# Patient Record
Sex: Female | Born: 1980 | Race: White | Hispanic: No | Marital: Single | Smoking: Current every day smoker
Health system: Southern US, Community
[De-identification: ages and names within clinical notes are randomized; demographics above are authoritative.]

## PROBLEM LIST (undated history)

## (undated) DIAGNOSIS — F329 Major depressive disorder, single episode, unspecified: Secondary | ICD-10-CM

## (undated) DIAGNOSIS — R112 Nausea with vomiting, unspecified: Secondary | ICD-10-CM

## (undated) DIAGNOSIS — F32A Depression, unspecified: Secondary | ICD-10-CM

## (undated) DIAGNOSIS — F3181 Bipolar II disorder: Secondary | ICD-10-CM

## (undated) DIAGNOSIS — M549 Dorsalgia, unspecified: Secondary | ICD-10-CM

## (undated) DIAGNOSIS — N809 Endometriosis, unspecified: Secondary | ICD-10-CM

## (undated) DIAGNOSIS — K5900 Constipation, unspecified: Secondary | ICD-10-CM

## (undated) DIAGNOSIS — Z87898 Personal history of other specified conditions: Secondary | ICD-10-CM

## (undated) DIAGNOSIS — Z8659 Personal history of other mental and behavioral disorders: Secondary | ICD-10-CM

## (undated) DIAGNOSIS — G43909 Migraine, unspecified, not intractable, without status migrainosus: Secondary | ICD-10-CM

## (undated) DIAGNOSIS — Z8614 Personal history of Methicillin resistant Staphylococcus aureus infection: Secondary | ICD-10-CM

## (undated) DIAGNOSIS — J449 Chronic obstructive pulmonary disease, unspecified: Secondary | ICD-10-CM

## (undated) DIAGNOSIS — I8390 Asymptomatic varicose veins of unspecified lower extremity: Secondary | ICD-10-CM

## (undated) DIAGNOSIS — Z972 Presence of dental prosthetic device (complete) (partial): Secondary | ICD-10-CM

## (undated) DIAGNOSIS — Z8619 Personal history of other infectious and parasitic diseases: Secondary | ICD-10-CM

## (undated) DIAGNOSIS — I10 Essential (primary) hypertension: Secondary | ICD-10-CM

## (undated) DIAGNOSIS — K029 Dental caries, unspecified: Secondary | ICD-10-CM

## (undated) HISTORY — PX: ANKLE SURGERY: SHX546

## (undated) HISTORY — DX: Presence of dental prosthetic device (complete) (partial): Z97.2

## (undated) HISTORY — DX: Personal history of other specified conditions: Z87.898

## (undated) HISTORY — DX: Asymptomatic varicose veins of unspecified lower extremity: I83.90

## (undated) HISTORY — DX: Depression, unspecified: F32.A

## (undated) HISTORY — DX: Nausea with vomiting, unspecified: R11.2

## (undated) HISTORY — DX: Bipolar II disorder: F31.81

## (undated) HISTORY — DX: Dorsalgia, unspecified: M54.9

## (undated) HISTORY — DX: Dental caries, unspecified: K02.9

## (undated) HISTORY — DX: Migraine, unspecified, not intractable, without status migrainosus: G43.909

## (undated) HISTORY — DX: Constipation, unspecified: K59.00

## (undated) HISTORY — PX: OTHER SURGICAL HISTORY: SHX169

## (undated) HISTORY — DX: Personal history of Methicillin resistant Staphylococcus aureus infection: Z86.14

## (undated) HISTORY — DX: Personal history of other mental and behavioral disorders: Z86.59

---
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
## 2005-09-23 LAB — OB RESULTS CONSOLE RUBELLA ANTIBODY, IGM
Rubella: IMMUNE
Rubella: IMMUNE

## 2005-09-23 LAB — OB RESULTS CONSOLE GC/CHLAMYDIA
Chlamydia: NEGATIVE
Gonorrhea: NEGATIVE

## 2005-09-23 LAB — OB RESULTS CONSOLE HIV ANTIBODY (ROUTINE TESTING): HIV: NONREACTIVE

## 2005-09-23 LAB — OB RESULTS CONSOLE TSH: TSH: 3

## 2005-10-20 LAB — OB RESULTS CONSOLE RUBELLA ANTIBODY, IGM: Rubella: UNDETERMINED

## 2006-05-06 ENCOUNTER — Observation Stay: Payer: Self-pay

## 2006-05-10 ENCOUNTER — Inpatient Hospital Stay: Payer: Self-pay | Admitting: Unknown Physician Specialty

## 2006-05-11 DIAGNOSIS — O139 Gestational [pregnancy-induced] hypertension without significant proteinuria, unspecified trimester: Secondary | ICD-10-CM

## 2006-06-04 ENCOUNTER — Emergency Department: Payer: Self-pay | Admitting: Emergency Medicine

## 2006-12-01 ENCOUNTER — Emergency Department: Payer: Self-pay | Admitting: Emergency Medicine

## 2010-04-26 ENCOUNTER — Emergency Department (HOSPITAL_COMMUNITY): Admission: EM | Admit: 2010-04-26 | Discharge: 2010-04-27 | Payer: Self-pay | Admitting: Emergency Medicine

## 2010-04-28 ENCOUNTER — Emergency Department: Payer: Self-pay | Admitting: Unknown Physician Specialty

## 2010-06-02 ENCOUNTER — Emergency Department: Payer: Self-pay | Admitting: Internal Medicine

## 2010-08-09 ENCOUNTER — Emergency Department: Payer: Self-pay | Admitting: Emergency Medicine

## 2010-08-11 ENCOUNTER — Emergency Department: Payer: Self-pay | Admitting: Emergency Medicine

## 2010-08-15 ENCOUNTER — Emergency Department: Payer: Self-pay | Admitting: Emergency Medicine

## 2011-04-03 ENCOUNTER — Emergency Department: Payer: Self-pay | Admitting: Emergency Medicine

## 2013-01-01 ENCOUNTER — Emergency Department: Payer: Self-pay | Admitting: Internal Medicine

## 2013-10-14 ENCOUNTER — Emergency Department: Payer: Self-pay | Admitting: Emergency Medicine

## 2013-10-15 ENCOUNTER — Emergency Department (HOSPITAL_COMMUNITY)
Admission: EM | Admit: 2013-10-15 | Discharge: 2013-10-15 | Discharge status: Against Medical Advice | Payer: Medicaid Other | Attending: Emergency Medicine | Admitting: Emergency Medicine

## 2013-10-15 ENCOUNTER — Encounter (HOSPITAL_COMMUNITY): Payer: Self-pay | Admitting: Emergency Medicine

## 2013-10-15 DIAGNOSIS — F17200 Nicotine dependence, unspecified, uncomplicated: Secondary | ICD-10-CM | POA: Insufficient documentation

## 2013-10-15 DIAGNOSIS — I10 Essential (primary) hypertension: Secondary | ICD-10-CM | POA: Insufficient documentation

## 2013-10-15 DIAGNOSIS — R42 Dizziness and giddiness: Secondary | ICD-10-CM | POA: Insufficient documentation

## 2013-10-15 DIAGNOSIS — R079 Chest pain, unspecified: Secondary | ICD-10-CM | POA: Insufficient documentation

## 2013-10-15 DIAGNOSIS — R0602 Shortness of breath: Secondary | ICD-10-CM | POA: Insufficient documentation

## 2013-10-15 NOTE — ED Notes (Addendum)
Pt c/o mid center chest pain that radiates to back area with sob, dizziness that started today, pt reports that the pain started after she started taking two new medications, was seen at [facility] yesterday for "knots" to bilateral rib cage area, had chest xray performed, given etodolac and diazepam and a shot while in er but does not know what the shot was. Pt states that the pain is made worse when she breathes out,

## 2013-10-15 NOTE — ED Notes (Signed)
Called patient to take to room. No answer. Patient left without being treated.

## 2015-01-24 IMAGING — CT CT CHEST W/O CM
1 series · 16 of 33 positions shown, 20 images · non-contrast
Comparison: none

REASON FOR EXAM: questionable abnormality on plain film; pain and soft
tissue swelling over [place]
COMMENTS:

PROCEDURE:     CT  - CT CHEST WITHOUT CONTRAST  - January 01, 2013  [DATE]
RESULT:     Comparison: None
TECHNIQUE: Multiple axial images of the chest were obtained without
intravenous contrast.

[Series 2: soft tissue · axial · 0.75mm/px · z∈[-143,+187]mm · 16 of 120 slices shown, 20 images]
[im 5/120  mediastinal]
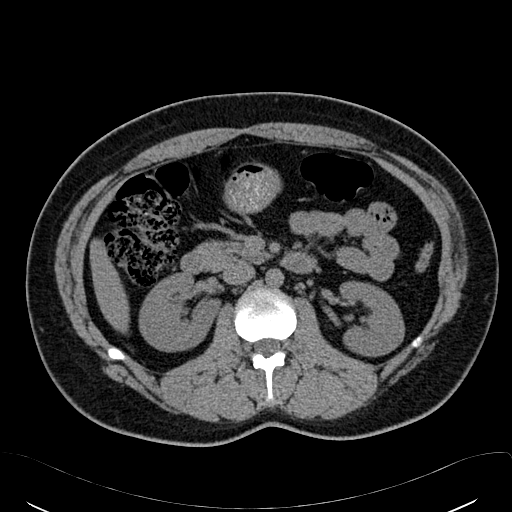
[im 5/120  lung]
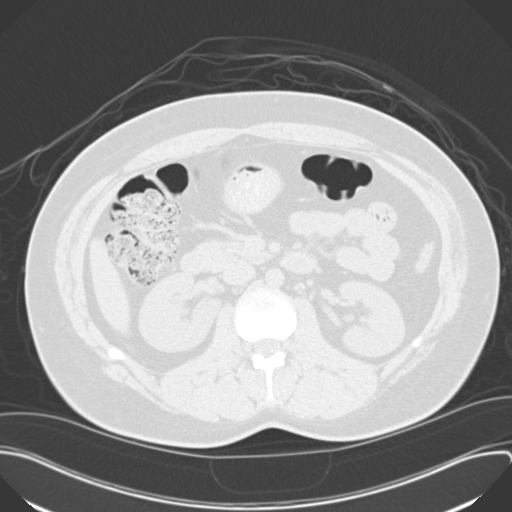
[im 14/120  lung]
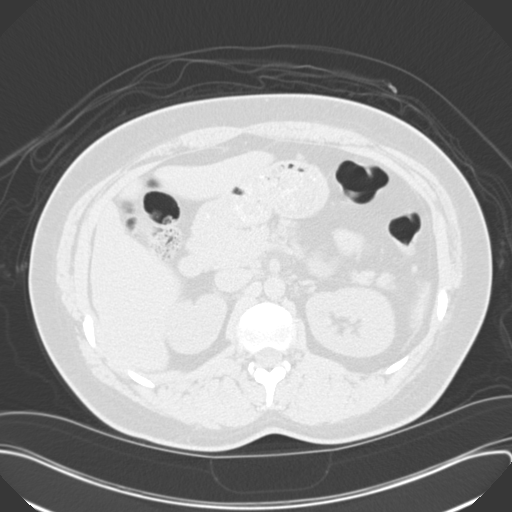
[im 23/120  lung]
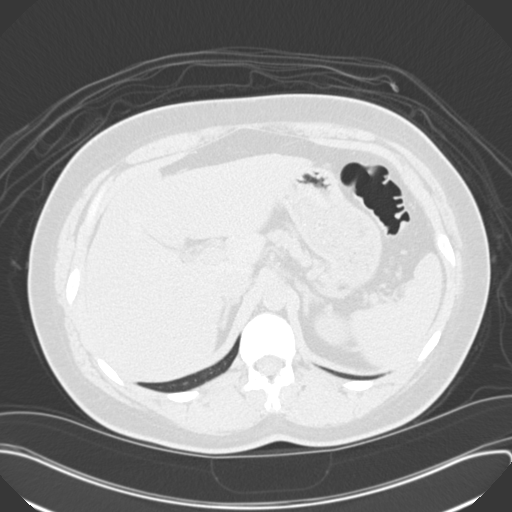
[im 27/120  lung]
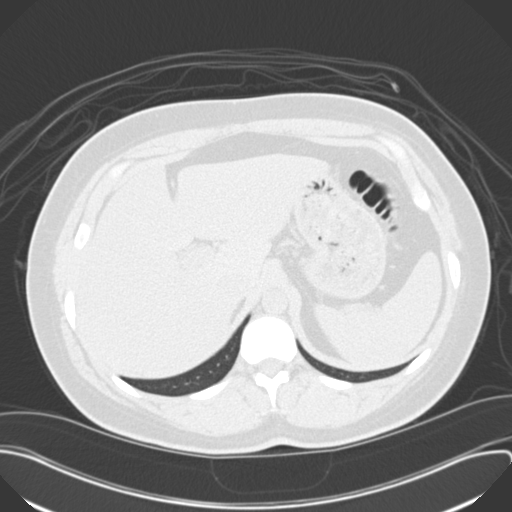
[im 36/120  mediastinal]
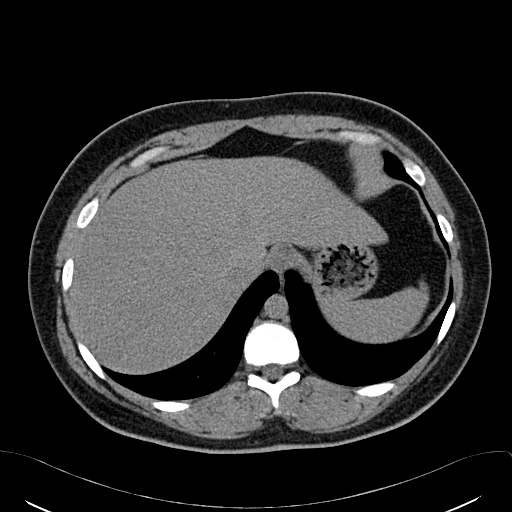
[im 36/120  lung]
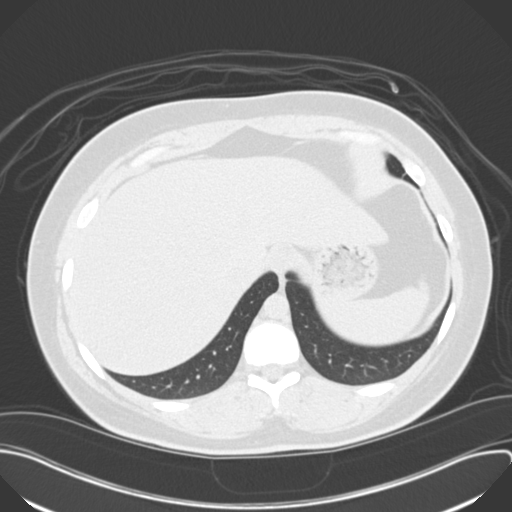
[im 45/120  lung]
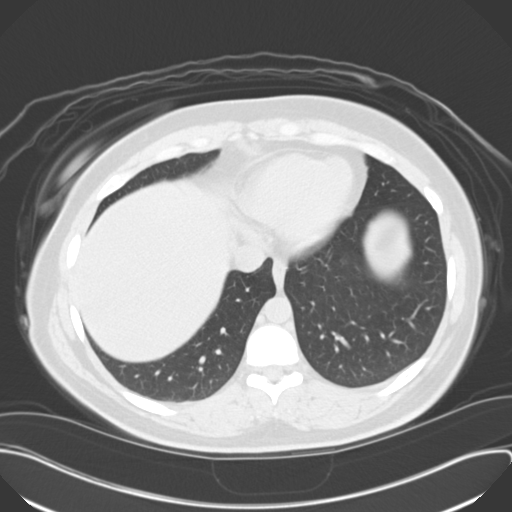
[im 49/120  lung]
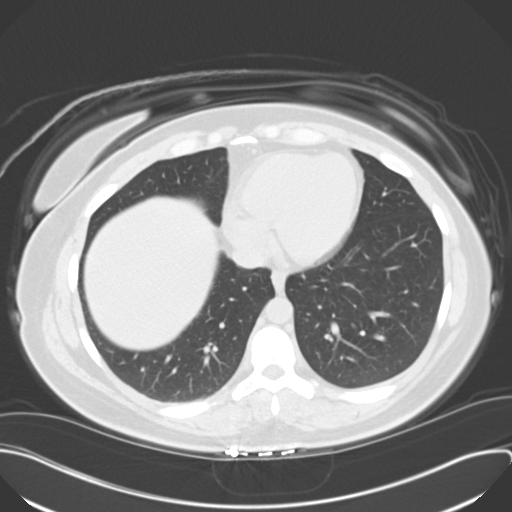
[im 58/120  lung]
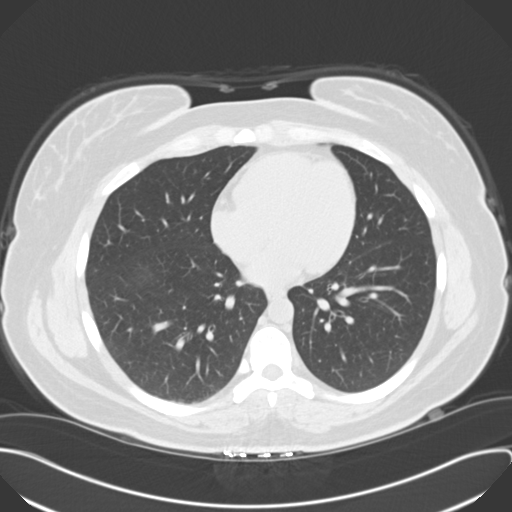
[im 63/120  mediastinal]
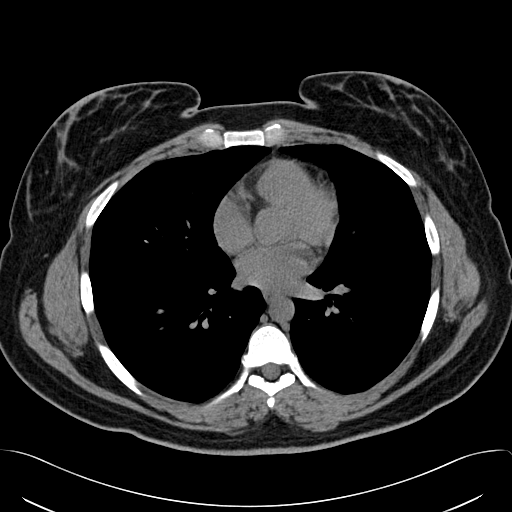
[im 63/120  lung]
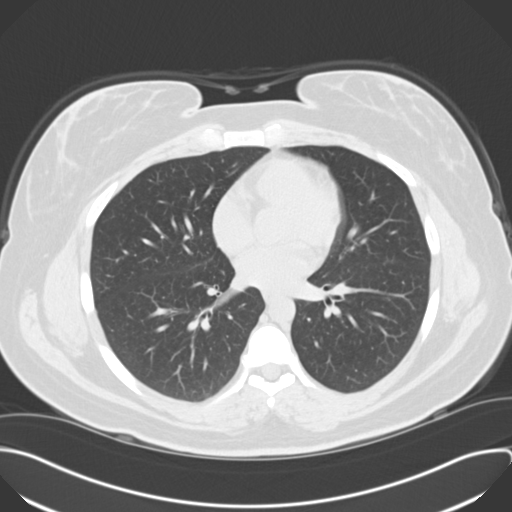
[im 71/120  lung]
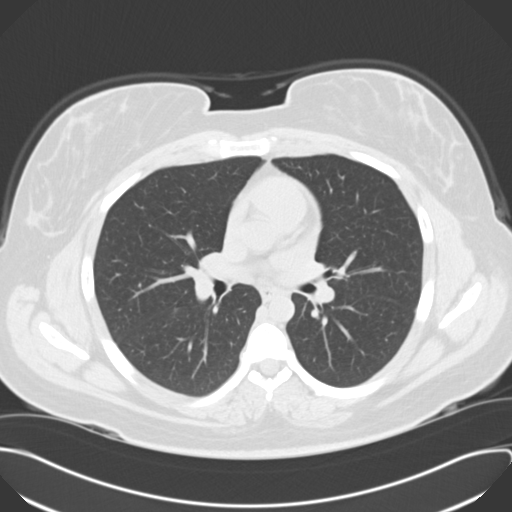
[im 75/120  lung]
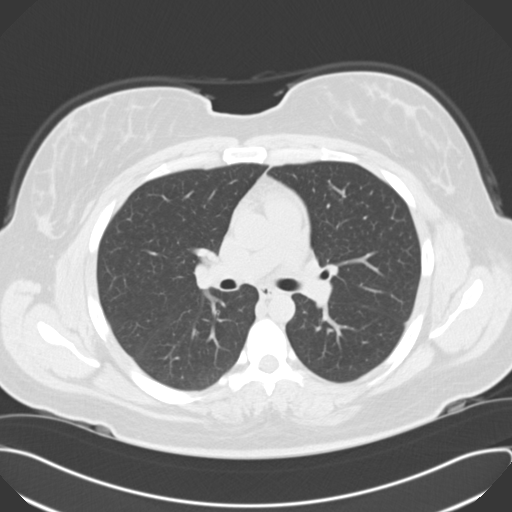
[im 84/120  lung]
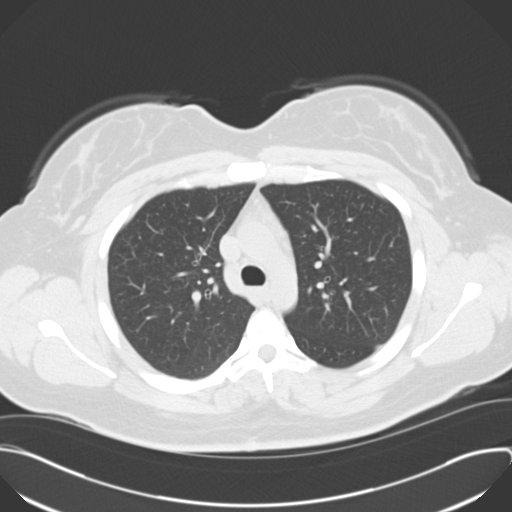
[im 93/120  mediastinal]
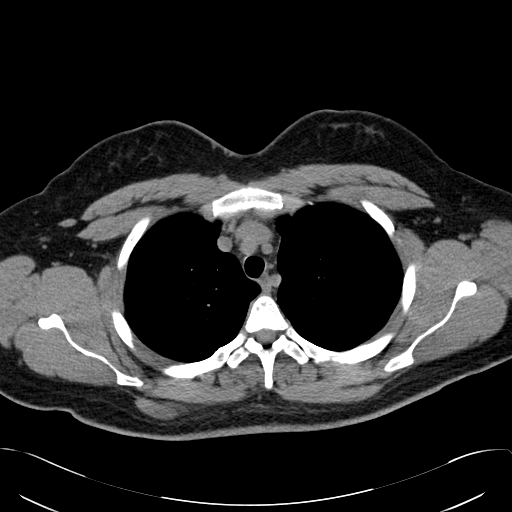
[im 93/120  lung]
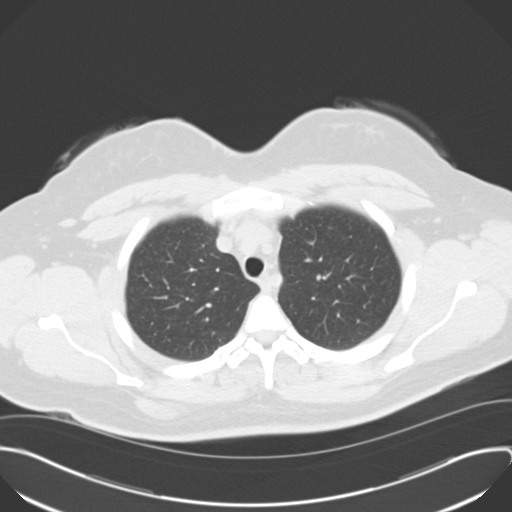
[im 97/120  lung]
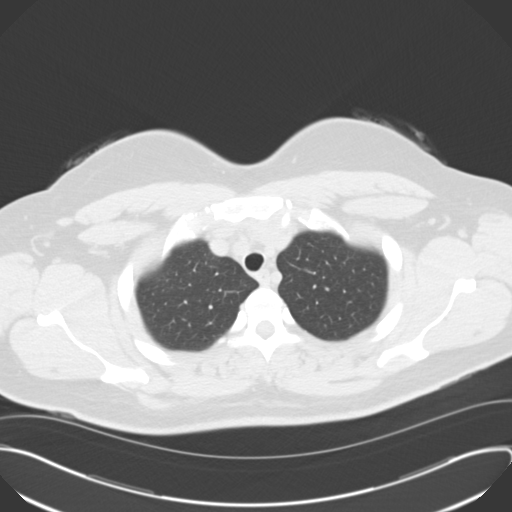
[im 106/120  lung]
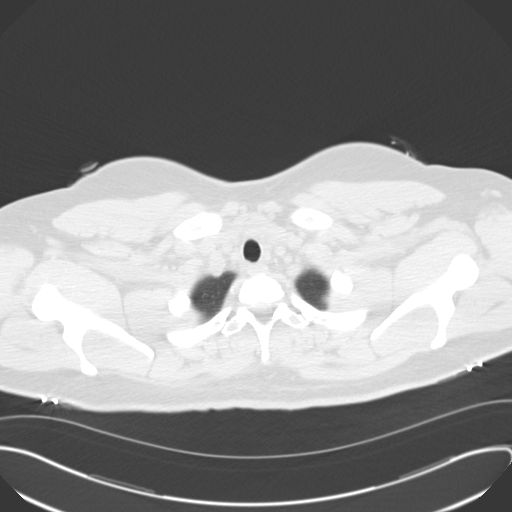
[im 115/120  lung]
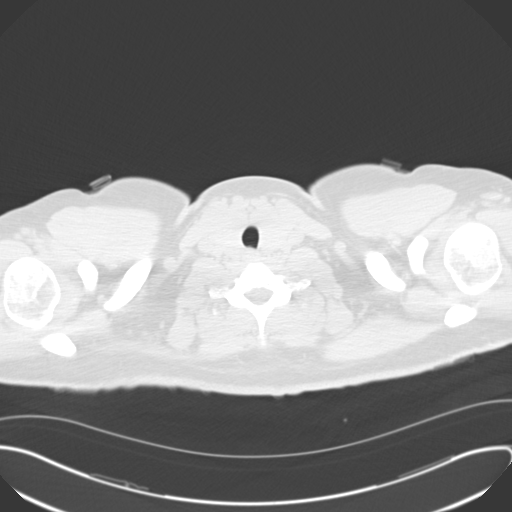

[16 of 33 positions shown; findings below may reference images not displayed]

FINDINGS: Minimal soft tissue density in the anterior mediastinum is likely to
residual thymic tissue. No mediastinal, hilar, or axillary lymphadenopathy.
A marker was placed along the inferior lateral aspect of the right
hemithorax at the site of the palpable abnormality. No mass is identified at
this site. This is at the level between the lateral right eleventh and
twelfth ribs.

No focal pulmonary opacities. The central airways are patent. There is a 3
mm nodule in the right lower lobe.

No aggressive lytic or sclerotic osseous lesions are identified.
IMPRESSION: No mass identified along the lateral right lower thorax at the site of
reported palpable abnormality.

[REDACTED]

## 2015-01-24 IMAGING — CR DG RIBS 2V*R*
1 series · 4 of 4 positions shown · non-contrast
Comparison: none

REASON FOR EXAM: pain
COMMENTS:

PROCEDURE:     DXR - DXR RIBS RIGHT UNILATERAL  - January 01, 2013  [DATE]
RESULT:     Comparison: None.

[Series 1: w ribs ap upper right · 0.14mm/px · 4 of 4 slices shown]
[im 1/4]
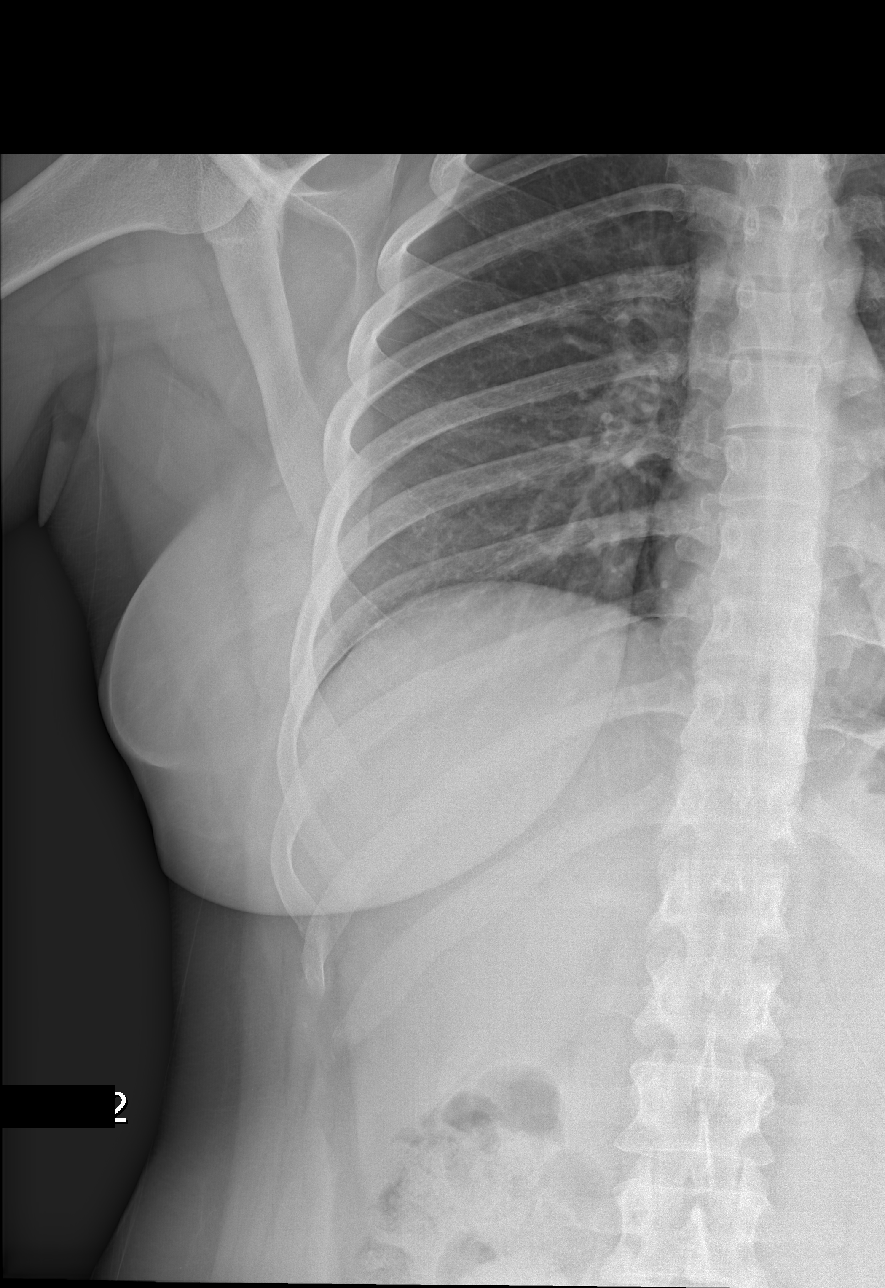
[im 2/4]
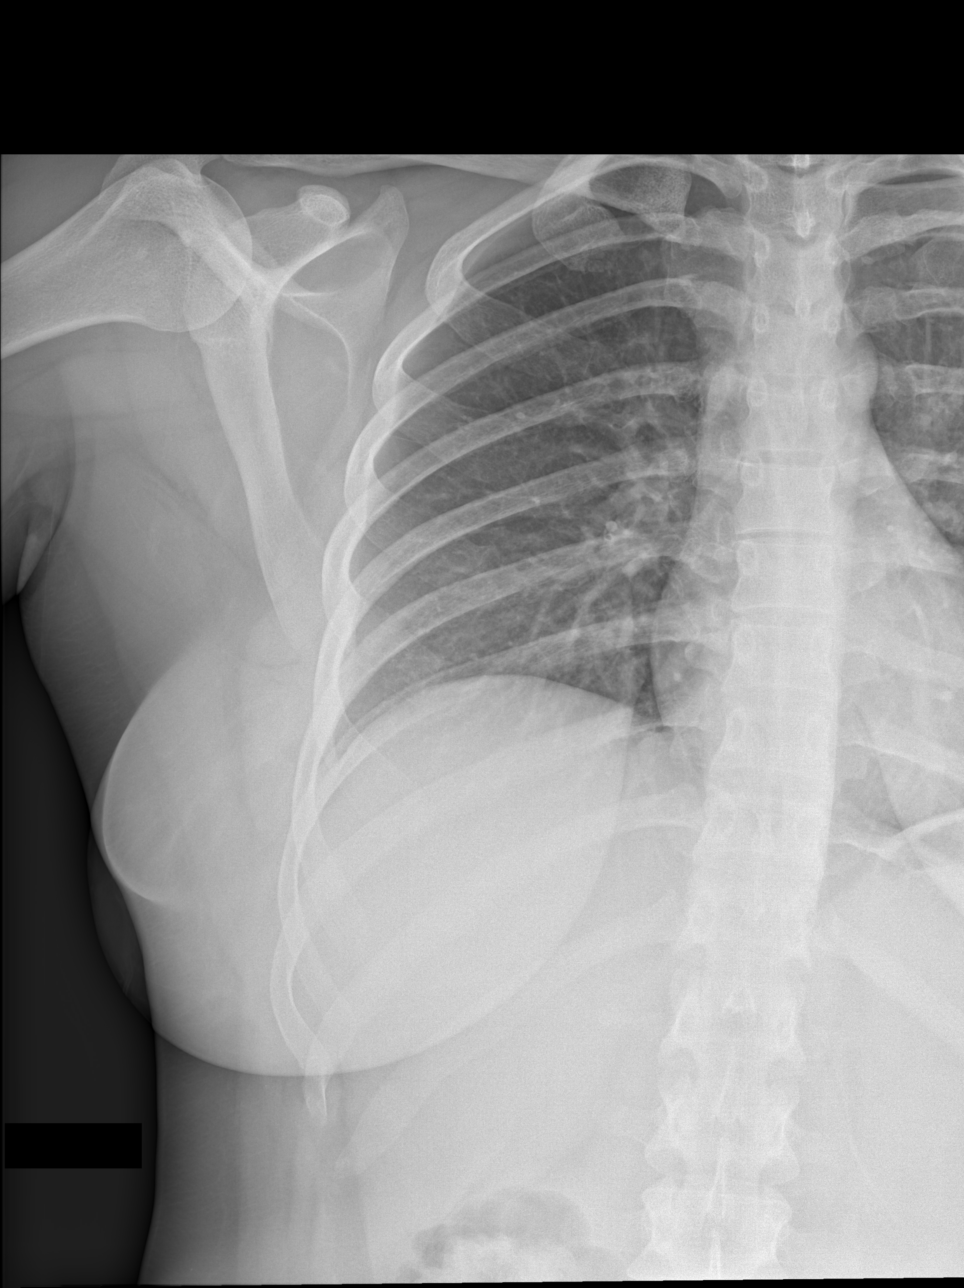
[im 3/4]
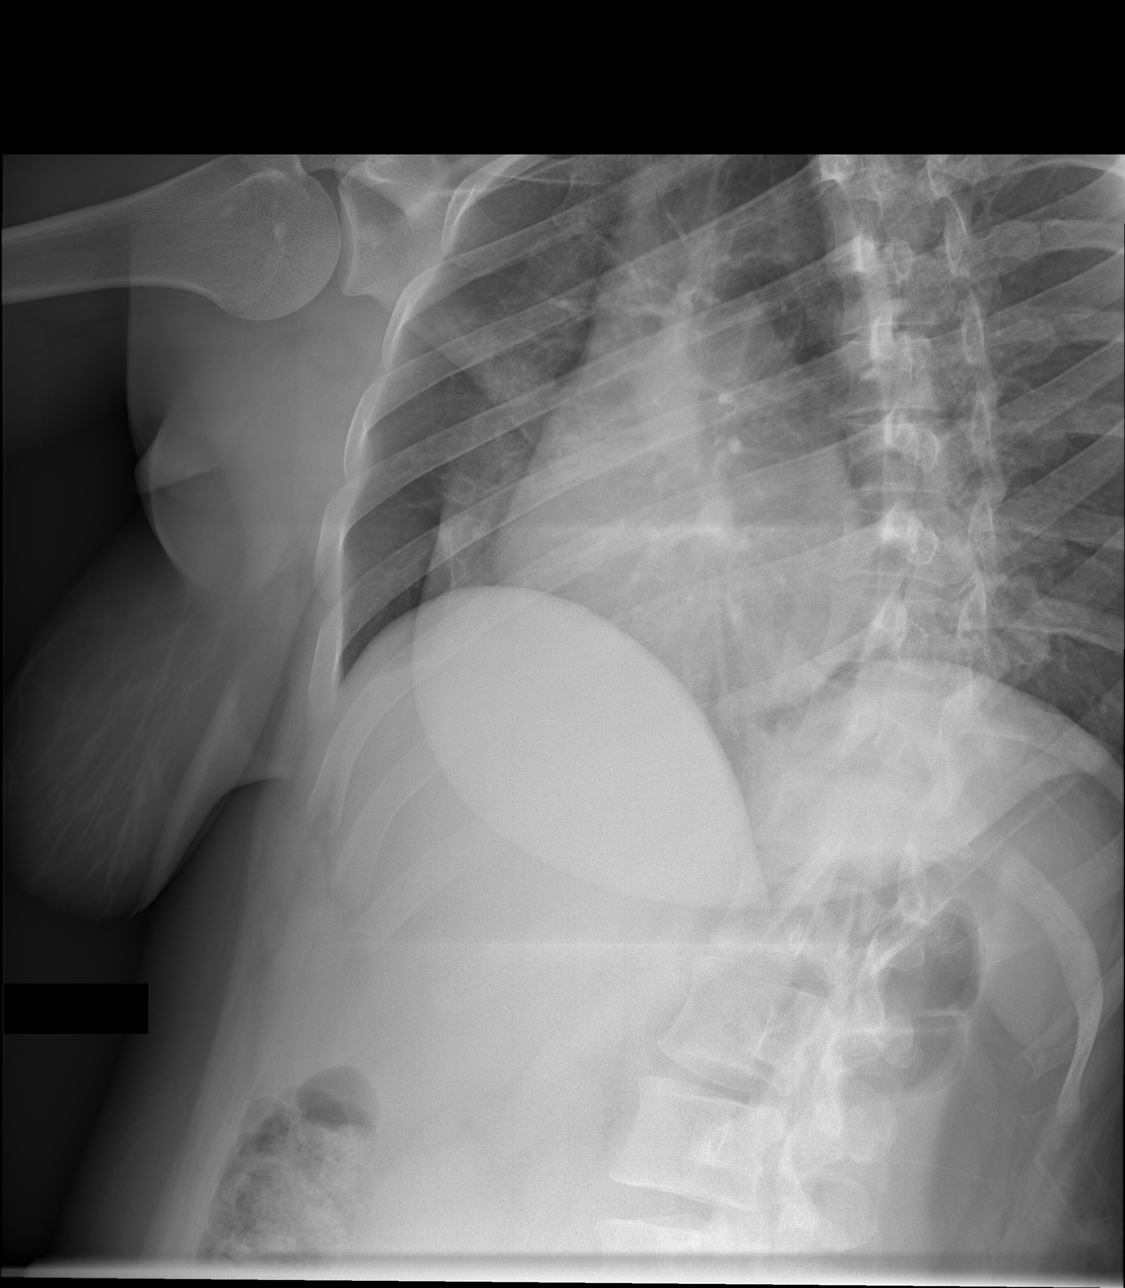
[im 4/4]
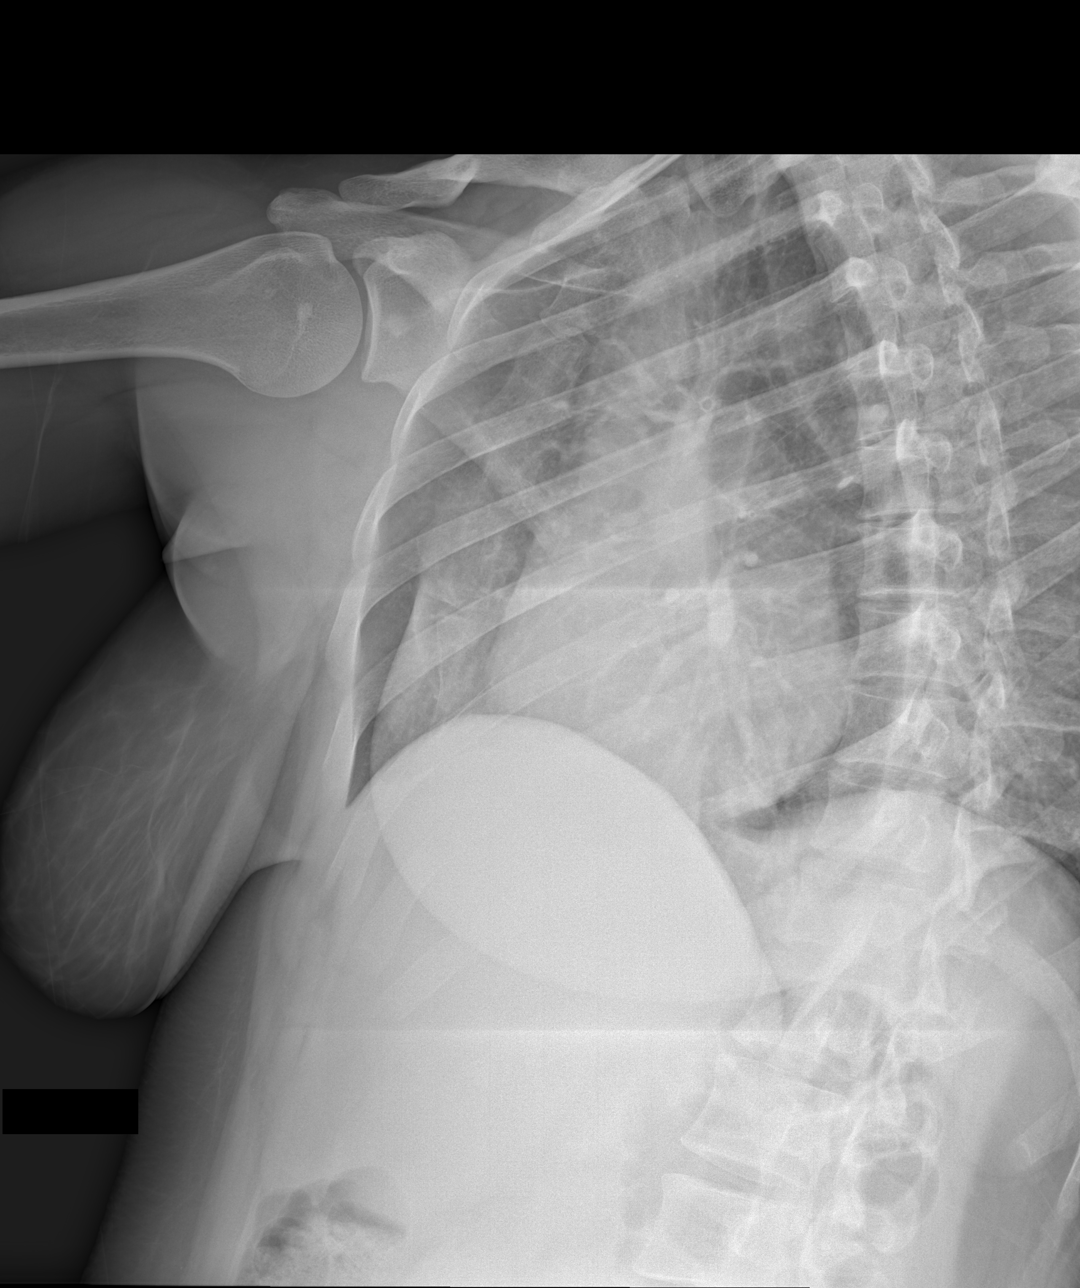

[4 of 4 positions shown; findings below may reference images not displayed]

FINDINGS: No displaced rib fracture seen.
IMPRESSION: No displaced rib fracture seen.

[REDACTED]

## 2015-05-28 ENCOUNTER — Emergency Department
Admission: EM | Admit: 2015-05-28 | Discharge: 2015-05-28 | Disposition: A | Discharge status: Home / Self Care | Payer: Medicaid Other | Attending: Emergency Medicine | Admitting: Emergency Medicine

## 2015-05-28 ENCOUNTER — Emergency Department: Payer: Medicaid Other

## 2015-05-28 ENCOUNTER — Encounter: Payer: Self-pay | Admitting: Emergency Medicine

## 2015-05-28 DIAGNOSIS — Z880 Allergy status to penicillin: Secondary | ICD-10-CM | POA: Insufficient documentation

## 2015-05-28 DIAGNOSIS — M7918 Myalgia, other site: Secondary | ICD-10-CM

## 2015-05-28 DIAGNOSIS — S6991XA Unspecified injury of right wrist, hand and finger(s), initial encounter: Secondary | ICD-10-CM | POA: Insufficient documentation

## 2015-05-28 DIAGNOSIS — S161XXA Strain of muscle, fascia and tendon at neck level, initial encounter: Secondary | ICD-10-CM | POA: Diagnosis not present

## 2015-05-28 DIAGNOSIS — Y998 Other external cause status: Secondary | ICD-10-CM | POA: Insufficient documentation

## 2015-05-28 DIAGNOSIS — Z720 Tobacco use: Secondary | ICD-10-CM | POA: Diagnosis not present

## 2015-05-28 DIAGNOSIS — Y9389 Activity, other specified: Secondary | ICD-10-CM | POA: Insufficient documentation

## 2015-05-28 DIAGNOSIS — Y92410 Unspecified street and highway as the place of occurrence of the external cause: Secondary | ICD-10-CM | POA: Insufficient documentation

## 2015-05-28 DIAGNOSIS — S199XXA Unspecified injury of neck, initial encounter: Secondary | ICD-10-CM | POA: Diagnosis present

## 2015-05-28 MED ORDER — DIAZEPAM 2 MG PO TABS: 2.0000 mg | ORAL_TABLET | Freq: Three times a day (TID) | ORAL | Status: AC | PRN

## 2015-05-28 MED ORDER — NAPROXEN 500 MG PO TABS
500.0000 mg | ORAL_TABLET | Freq: Once | ORAL | Status: AC
  Administered 2015-05-28: 500 mg via ORAL
  Filled 2015-05-28: qty 1

## 2015-05-28 MED ORDER — DIAZEPAM 2 MG PO TABS
2.0000 mg | ORAL_TABLET | Freq: Once | ORAL | Status: AC
  Administered 2015-05-28: 2 mg via ORAL
  Filled 2015-05-28: qty 1

## 2015-05-28 MED ORDER — NAPROXEN 500 MG PO TABS: 500.0000 mg | ORAL_TABLET | Freq: Two times a day (BID) | ORAL | Status: AC
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
## 2015-05-28 NOTE — Discharge Instructions (Signed)
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
## 2015-07-04 ENCOUNTER — Other Ambulatory Visit: Payer: Self-pay | Admitting: Nurse Practitioner

## 2015-07-04 DIAGNOSIS — M272 Inflammatory conditions of jaws: Secondary | ICD-10-CM

## 2015-07-09 ENCOUNTER — Ambulatory Visit
Admission: RE | Admit: 2015-07-09 | Discharge: 2015-07-09 | Disposition: A | Discharge status: Home / Self Care | Payer: Medicaid Other | Source: Ambulatory Visit | Attending: Nurse Practitioner | Admitting: Nurse Practitioner

## 2015-07-09 DIAGNOSIS — K029 Dental caries, unspecified: Secondary | ICD-10-CM | POA: Insufficient documentation

## 2015-07-09 DIAGNOSIS — M272 Inflammatory conditions of jaws: Secondary | ICD-10-CM

## 2015-07-15 ENCOUNTER — Emergency Department
Admission: EM | Admit: 2015-07-15 | Discharge: 2015-07-15 | Disposition: A | Discharge status: Home / Self Care | Payer: Medicaid Other | Attending: Emergency Medicine | Admitting: Emergency Medicine

## 2015-07-15 ENCOUNTER — Encounter: Payer: Self-pay | Admitting: *Deleted

## 2015-07-15 DIAGNOSIS — M273 Alveolitis of jaws: Secondary | ICD-10-CM | POA: Insufficient documentation

## 2015-07-15 DIAGNOSIS — Z880 Allergy status to penicillin: Secondary | ICD-10-CM | POA: Insufficient documentation

## 2015-07-15 DIAGNOSIS — Z720 Tobacco use: Secondary | ICD-10-CM | POA: Diagnosis not present

## 2015-07-15 DIAGNOSIS — Z791 Long term (current) use of non-steroidal anti-inflammatories (NSAID): Secondary | ICD-10-CM | POA: Insufficient documentation

## 2015-07-15 DIAGNOSIS — K0889 Other specified disorders of teeth and supporting structures: Secondary | ICD-10-CM | POA: Diagnosis present

## 2015-07-15 MED ORDER — HYDROCODONE-ACETAMINOPHEN 5-325 MG PO TABS: 1.0000 | ORAL_TABLET | ORAL | Status: DC | PRN

## 2015-07-15 MED ORDER — TRIAMCINOLONE ACETONIDE 0.1 % MT PSTE
PASTE | Freq: Three times a day (TID) | OROMUCOSAL | Status: DC
  Administered 2015-07-15: 23:00:00 via OROMUCOSAL
  Filled 2015-07-15: qty 5

## 2015-07-15 MED ORDER — OXYCODONE HCL 5 MG PO TABS
5.0000 mg | ORAL_TABLET | Freq: Once | ORAL | Status: AC
Start: 2015-07-15 — End: 2015-07-15
  Administered 2015-07-15: 5 mg via ORAL
  Filled 2015-07-15: qty 1

## 2015-07-15 NOTE — ED Notes (Signed)

## 2015-07-15 NOTE — ED Provider Notes (Signed)
[facility] Emergency Department Provider Note  ____________________________________________  Time seen: Approximately 9:55 PM  I have reviewed the triage vital signs and the nursing notes.   HISTORY  Chief Complaint Dental Problem    HPI Stacey Dunn is a 34 y.o. female who presents to the emergency department for evaluation of dental pain. She states that she had 9 teeth extracted last Thursday. She states that the pain started suddenly today around 4:30. She is currently taking clindamycin as prescribed. She was given Vicodin after extraction, but she is currently out of her pain medicine. She states that the pain is in the front of her mouth and shoots up just below her eye. The pain is sharp with any air exposure.   No past medical history on file.  There are no active problems to display for this patient.   No past surgical history on file.  Current Outpatient Rx  Name  Route  Sig  Dispense  Refill  . diazepam (VALIUM) 2 MG tablet   Oral   Take 1 tablet (2 mg total) by mouth every 8 (eight) hours as needed for anxiety.   12 tablet   0   . HYDROcodone-acetaminophen (NORCO/VICODIN) 5-325 MG tablet   Oral   Take 1 tablet by mouth every 4 (four) hours as needed.   12 tablet   0   . naproxen (NAPROSYN) 500 MG tablet   Oral   Take 1 tablet (500 mg total) by mouth 2 (two) times daily with a meal.   60 tablet   2     Allergies Morphine and related; Penicillins; and Sulfa antibiotics  No family history on file.  Social History Social History  Substance Use Topics  . Smoking status: Current Every Day Smoker  . Smokeless tobacco: None  . Alcohol Use: No    Review of Systems Constitutional: No fever/chills Eyes: No visual changes. ENT: No sore throat. Cardiovascular: Denies chest pain. Respiratory: Denies shortness of breath. Gastrointestinal: No abdominal pain.  No nausea, no vomiting.  Genitourinary: Negative for  dysuria. Musculoskeletal: Negative for back pain. Skin: Negative for rash. Neurological: Negative for headaches, focal weakness or numbness. 10-point ROS otherwise negative.  ____________________________________________   PHYSICAL EXAM:  VITAL SIGNS: ED Triage Vitals  Enc Vitals Group     BP 07/15/15 2124 127/80 mmHg     Pulse Rate 07/15/15 2124 88     Resp 07/15/15 2124 20     Temp 07/15/15 2124 98.1 F (36.7 C)     Temp Source 07/15/15 2124 Oral     SpO2 07/15/15 2124 99 %     Weight 07/15/15 2124 182 lb (82.555 kg)     Height 07/15/15 2124 5\' 4"  (1.626 m)     Head Cir --      Peak Flow --      Pain Score 07/15/15 2126 8     Pain Loc --      Pain Edu? --      Excl. in GC? --     Constitutional: Alert and oriented. Well appearing and in no acute distress. Eyes: Conjunctivae are normal. PERRL. EOMI. Head: Atraumatic. Nose: No congestion/rhinnorhea. Mouth/Throat: Mucous membranes are moist.  Oropharynx non-erythematous. Periodontal Exam    Neck: No stridor.  Hematological/Lymphatic/Immunilogical: No cervical lymphadenopathy. Cardiovascular:   Good peripheral circulation. Respiratory: Normal respiratory effort.  No retractions. Musculoskeletal: No lower extremity tenderness nor edema.  No joint effusions. Neurologic:  Normal speech and language. No gross focal neurologic  deficits are appreciated. Speech is normal. No gait instability. Skin:  Skin is warm, dry and intact. No rash noted. Psychiatric: Mood and affect are normal. Speech and behavior are normal.  ____________________________________________   LABS (all labs ordered are listed, but only abnormal results are displayed)  Labs Reviewed - No data to display ____________________________________________   RADIOLOGY  Not indicated ____________________________________________   PROCEDURES  Procedure(s) performed: None  Critical Care performed:  No  ____________________________________________   INITIAL IMPRESSION / ASSESSMENT AND PLAN / ED COURSE  Pertinent labs & imaging results that were available during my care of the patient were reviewed by me and considered in my medical decision making (see chart for details).  Kenalog paste to be inserted into the socket 3 times per day. She is to call tomorrow to discuss this pain with the dentist. She will be given a prescription for hydrocodone 5/325/ #12 tablets. She is toContinue the clindamycin as prescribed. She was also advised to rinse with warm saltwater 4 times per day.  ____________________________________________   FINAL CLINICAL IMPRESSION(S) / ED DIAGNOSES  Final diagnoses:  Dry tooth socket       Chinita Pester, FNP 07/15/15 2210  Minna Antis, MD 07/15/15 2246

## 2015-07-15 NOTE — ED Notes (Signed)
Reports having 8 teeth extracted last Thursday.  Pain began today at around 1630.  Curently taking Clindamycin.

## 2015-07-15 NOTE — Discharge Instructions (Signed)
Dental Dry Socket °After a tooth is pulled (extracted), blood fills up the hole (socket) where the tooth once was. This blood hardens (clots) and protects the bone and nerves underneath. Normally, gums completely grow over the top of the bones and nerves and close an open socket in about a week. After several months, the clot is replaced by bone that grows into the socket. °However, when blood does not fill up the extraction socket, or the blood clot is lost for some reason, a dry socket may form. This condition leaves the bone and nerves exposed to air, food, liquid, or anything else that enters the mouth. The gums cannot grow over the extraction socket because there is nothing to grow over, and the dry socket remains open.  °CAUSES  °· Blood not filling up the extraction socket properly. °· Anything that can dislodge a forming blood clot. Forceful spitting or sucking through a straw can pull a blood clot completely out of the socket and cause a dry socket. °· Having a difficult extraction. The forceful pushing against the wall of the socket when the tooth is extracted causes the walls of the tooth socket to become crushed. This prevents bleeding into the socket because the blood vessels have been crushed closed. °· Alcoholic drinks may dry out the blood clot and cause a dry socket. °· Smoking can disturb blood clot formation and cause a dry socket. °Factors that put you at an increased risk for a dry socket include:  °· Having lower teeth extracted. °· Being female. °· Poor oral hygiene. °· Taking birth control pills. °· Having your wisdom teeth extracted. °SYMPTOMS °· Severe, constant, dull throbbing pain. The pain generally begins 2 to 3 days after the tooth extraction. The pain may last about a week after it begins. °· Bad smelling breath and bad taste in your mouth. °· Ear pain. °HOME CARE INSTRUCTIONS °· Follow your dentist's instructions. °· Only take over-the-counter or prescription medicines for pain,  discomfort, or fever as directed by your caregiver. If pain medication does not relieve the pain, you may need to see your dentist who can clean the socket and place a medicated dressing on the extraction to promote healing. °· Take your antibiotics as told, if prescribed. Finish them even if you start to feel better. °· Wait at least a day before rinsing with warm salt water to avoid possibly dissolving the new blood clot. When salt water rinsing, spit gently to avoid pressure on the clot. °· Avoid carbonated beverages. °· Avoid alcohol. °· Avoid smoking for a few days after surgery. °Patients who have recently had oral surgery should avoid anything that may irritate the extraction socket or anything that may cause the blood clot inside the extraction socket from being dislodged. Carefully follow your instructions for after surgery care.  °SEEK IMMEDIATE DENTAL CARE IF: °· Your medicine does not relieve pain. °· You have uncontrolled bleeding, marked swelling, or severe pain. °· You develop a fever above 102° F (38.9° C), not controlled by medication. °· You have difficulty swallowing or cannot open your mouth. °· You have other severe symptoms. °  °This information is not intended to replace advice given to you by your health care provider. Make sure you discuss any questions you have with your health care provider. °  °Document Released: 03/14/2003 Document Revised: 11/30/2011 Document Reviewed: 04/22/2015 °Elsevier Interactive Patient Education ©2016 [employer] ° °

## 2015-07-15 NOTE — ED Notes (Signed)
Pt has dental extractions last week and now pt continues to have pain and states i think my mouth is infected.

## 2015-10-08 ENCOUNTER — Other Ambulatory Visit: Payer: Self-pay | Admitting: Specialist

## 2015-10-08 DIAGNOSIS — R911 Solitary pulmonary nodule: Secondary | ICD-10-CM

## 2015-11-04 DIAGNOSIS — R0781 Pleurodynia: Secondary | ICD-10-CM | POA: Insufficient documentation

## 2016-03-13 ENCOUNTER — Ambulatory Visit
Admission: RE | Admit: 2016-03-13 | Discharge: 2016-03-13 | Disposition: A | Discharge status: Home / Self Care | Payer: Medicaid Other | Source: Ambulatory Visit | Attending: Specialist | Admitting: Specialist

## 2016-03-13 DIAGNOSIS — R911 Solitary pulmonary nodule: Secondary | ICD-10-CM | POA: Diagnosis present

## 2016-03-23 ENCOUNTER — Ambulatory Visit: Payer: No Typology Code available for payment source

## 2016-04-23 ENCOUNTER — Other Ambulatory Visit: Payer: Self-pay | Admitting: Physician Assistant

## 2016-04-23 DIAGNOSIS — R0781 Pleurodynia: Secondary | ICD-10-CM

## 2016-04-30 DIAGNOSIS — R252 Cramp and spasm: Secondary | ICD-10-CM | POA: Insufficient documentation

## 2016-05-04 ENCOUNTER — Ambulatory Visit: Payer: No Typology Code available for payment source

## 2016-05-05 ENCOUNTER — Ambulatory Visit
Admission: RE | Admit: 2016-05-05 | Discharge: 2016-05-05 | Disposition: A | Discharge status: Home / Self Care | Payer: Medicaid Other | Source: Ambulatory Visit | Attending: Physician Assistant | Admitting: Physician Assistant

## 2016-05-05 DIAGNOSIS — M47895 Other spondylosis, thoracolumbar region: Secondary | ICD-10-CM | POA: Diagnosis not present

## 2016-05-05 DIAGNOSIS — R0781 Pleurodynia: Secondary | ICD-10-CM | POA: Diagnosis present

## 2016-05-05 MED ORDER — TECHNETIUM TC 99M MEDRONATE IV KIT
25.0000 | PACK | Freq: Once | INTRAVENOUS | Status: AC | PRN
  Administered 2016-05-05: 23.54 via INTRAVENOUS

## 2016-08-12 ENCOUNTER — Other Ambulatory Visit: Payer: Self-pay | Admitting: Physician Assistant

## 2016-08-12 DIAGNOSIS — M533 Sacrococcygeal disorders, not elsewhere classified: Secondary | ICD-10-CM

## 2016-08-25 ENCOUNTER — Ambulatory Visit
Admission: RE | Admit: 2016-08-25 | Discharge: 2016-08-25 | Disposition: A | Discharge status: Home / Self Care | Payer: Medicaid Other | Source: Ambulatory Visit | Attending: Physician Assistant | Admitting: Physician Assistant

## 2016-08-25 DIAGNOSIS — M533 Sacrococcygeal disorders, not elsewhere classified: Secondary | ICD-10-CM | POA: Insufficient documentation

## 2016-08-25 MED ORDER — GADOBENATE DIMEGLUMINE 529 MG/ML IV SOLN
20.0000 mL | Freq: Once | INTRAVENOUS | Status: AC | PRN
  Administered 2016-08-25: 18 mL via INTRAVENOUS

## 2018-09-29 ENCOUNTER — Emergency Department: Admission: EM | Admit: 2018-09-29 | Discharge: 2018-09-29 | Discharge status: Absent without leave | Payer: Self-pay

## 2018-09-29 DIAGNOSIS — Z3490 Encounter for supervision of normal pregnancy, unspecified, unspecified trimester: Secondary | ICD-10-CM | POA: Insufficient documentation

## 2018-09-29 DIAGNOSIS — R112 Nausea with vomiting, unspecified: Secondary | ICD-10-CM | POA: Insufficient documentation

## 2018-09-29 DIAGNOSIS — R1032 Left lower quadrant pain: Secondary | ICD-10-CM | POA: Insufficient documentation

## 2018-09-29 DIAGNOSIS — R1030 Lower abdominal pain, unspecified: Secondary | ICD-10-CM | POA: Insufficient documentation

## 2018-10-17 ENCOUNTER — Other Ambulatory Visit: Payer: Self-pay | Admitting: Advanced Practice Midwife

## 2018-10-17 DIAGNOSIS — Z369 Encounter for antenatal screening, unspecified: Secondary | ICD-10-CM

## 2018-10-18 LAB — OB RESULTS CONSOLE HGB/HCT, BLOOD
HCT: 44 — AB (ref 29–41)
Hemoglobin: 14.9

## 2018-10-18 LAB — OB RESULTS CONSOLE RUBELLA ANTIBODY, IGM: Rubella: IMMUNE

## 2018-10-18 LAB — OB RESULTS CONSOLE RPR: RPR: NONREACTIVE

## 2018-10-18 LAB — OB RESULTS CONSOLE HEPATITIS B SURFACE ANTIGEN: Hepatitis B Surface Ag: NEGATIVE

## 2018-10-18 LAB — OB RESULTS CONSOLE ABO/RH
ABO/RH(D): O NEG
RH Type: NEGATIVE

## 2018-10-18 LAB — OB RESULTS CONSOLE PLATELET COUNT: Platelets: 285000

## 2018-10-18 LAB — OB RESULTS CONSOLE VARICELLA ZOSTER ANTIBODY, IGG: Varicella: IMMUNE

## 2018-11-21 ENCOUNTER — Ambulatory Visit (HOSPITAL_BASED_OUTPATIENT_CLINIC_OR_DEPARTMENT_OTHER)
Admission: RE | Admit: 2018-11-21 | Discharge: 2018-11-21 | Disposition: A | Discharge status: Home / Self Care | Payer: Medicaid Other | Source: Ambulatory Visit | Attending: Obstetrics and Gynecology | Admitting: Obstetrics and Gynecology

## 2018-11-21 ENCOUNTER — Ambulatory Visit
Admission: RE | Admit: 2018-11-21 | Discharge: 2018-11-21 | Disposition: A | Discharge status: Home / Self Care | Payer: Medicaid Other | Source: Ambulatory Visit | Attending: Advanced Practice Midwife | Admitting: Advanced Practice Midwife

## 2018-11-21 VITALS — BP 126/78 | HR 102 | Temp 97.9°F | Resp 18 | Ht 65.0 in | Wt 199.0 lb

## 2018-11-21 DIAGNOSIS — O09529 Supervision of elderly multigravida, unspecified trimester: Secondary | ICD-10-CM | POA: Insufficient documentation

## 2018-11-21 DIAGNOSIS — O99211 Obesity complicating pregnancy, first trimester: Secondary | ICD-10-CM | POA: Diagnosis not present

## 2018-11-21 DIAGNOSIS — O09521 Supervision of elderly multigravida, first trimester: Secondary | ICD-10-CM

## 2018-11-21 DIAGNOSIS — Z3682 Encounter for antenatal screening for nuchal translucency: Secondary | ICD-10-CM | POA: Diagnosis not present

## 2018-11-21 DIAGNOSIS — Z3A13 13 weeks gestation of pregnancy: Secondary | ICD-10-CM | POA: Diagnosis not present

## 2018-11-21 DIAGNOSIS — Z369 Encounter for antenatal screening, unspecified: Secondary | ICD-10-CM

## 2018-11-21 HISTORY — DX: Essential (primary) hypertension: I10

## 2018-11-21 LAB — CYSTIC FIBROSIS DIAGNOSTIC STUDY: Interpretation-CFDNA:: NEGATIVE

## 2018-11-21 LAB — SICKLE CELL SCREEN: Sickle Cell Screen: NORMAL

## 2018-11-21 NOTE — Progress Notes (Addendum)
Referring Provider:  [facility] Department Length of Consultation: 60 minutes  Stacey Dunn was referred to [facility] for genetic counseling because of advanced maternal age.  The patient will be 38 years old at the time of delivery.  This note summarizes the information we discussed.    We explained that the chance of a chromosome abnormality increases with maternal age.  Chromosomes and examples of chromosome problems were reviewed.  Humans typically have 46 chromosomes in each cell, with half passed through each sperm and egg.  Any change in the number or structure of chromosomes can increase the risk of problems in the physical and mental development of a pregnancy.   Based upon age of the patient, the chance of any chromosome abnormality was 1 in 34. The chance of Down syndrome, the most common chromosome problem associated with maternal age, was 1 in 23.  The risk of chromosome problems is in addition to the 3% general population risk for birth defects and mental retardation.  The greatest chance, of course, is that the baby would be born in good health.  We discussed the following prenatal screening and testing options for this pregnancy:  First trimester screening, which includes nuchal translucency ultrasound screen and first trimester maternal serum marker screening.  The nuchal translucency has approximately an 80% detection rate for Down syndrome and can be positive for other chromosome abnormalities as well as heart defects.  When combined with a maternal serum marker screening, the detection rate is up to 90% for Down syndrome and up to 97% for trisomy 18.     The chorionic villus sampling procedure is available for first trimester chromosome analysis.  This involves the withdrawal of a small amount of chorionic villi (tissue from the developing placenta).  Risk of pregnancy loss is estimated to be approximately 1 in 200 to 1 in 100 (0.5 to 1%).   There is approximately a 1% (1 in 100) chance that the CVS chromosome results will be unclear.  Chorionic villi cannot be tested for neural tube defects.     Maternal serum marker screening, a blood test that measures pregnancy proteins, can provide risk assessments for Down syndrome, trisomy 18, and open neural tube defects (spina bifida, anencephaly). Because it does not directly examine the fetus, it cannot positively diagnose or rule out these problems.  Targeted ultrasound uses high frequency sound waves to create an image of the developing fetus.  An ultrasound is often recommended as a routine means of evaluating the pregnancy.  It is also used to screen for fetal anatomy problems (for example, a heart defect) that might be suggestive of a chromosomal or other abnormality.   Amniocentesis involves the removal of a small amount of amniotic fluid from the sac surrounding the fetus with the use of a thin needle inserted through the maternal abdomen and uterus.  Ultrasound guidance is used throughout the procedure.  Fetal cells from amniotic fluid are directly evaluated and > 99.5% of chromosome problems and > 98% of open neural tube defects can be detected. This procedure is generally performed after the 15th week of pregnancy.  The main risks to this procedure include complications leading to miscarriage in less than 1 in 200 cases (0.5%).  We also reviewed the availability of cell free fetal DNA testing from maternal blood to determine whether or not the baby may have either Down syndrome, trisomy 53, or trisomy 9.  This test utilizes a maternal blood sample and  DNA sequencing technology to isolate circulating cell free fetal DNA from maternal plasma.  The fetal DNA can then be analyzed for DNA sequences that are derived from the three most common chromosomes involved in aneuploidy, chromosomes 13, 18, and 21.  If the overall amount of DNA is greater than the expected level for any of these  chromosomes, aneuploidy is suspected.  While we do not consider it a replacement for invasive testing and karyotype analysis, a negative result from this testing would be reassuring, though not a guarantee of a normal chromosome complement for the baby.  An abnormal result is certainly suggestive of an abnormal chromosome complement, though we would still recommend CVS or amniocentesis to confirm any findings from this testing.  Cystic Fibrosis and Spinal Muscular Atrophy (SMA) screening were also discussed with the patient. Both conditions are recessive, which means that both parents must be carriers in order to have a child with the disease.  Cystic fibrosis (CF) is one of the most common genetic conditions in persons of Caucasian ancestry.  This condition occurs in approximately 1 in 2,500 Caucasian persons and results in thickened secretions in the lungs, digestive, and reproductive systems.  For a baby to be at risk for having CF, both of the parents must be carriers for this condition.  Approximately 1 in 45 Caucasian persons is a carrier for CF.  Current carrier testing looks for the most common mutations in the gene for CF and can detect approximately 90% of carriers in the Caucasian population.  This means that the carrier screening can greatly reduce, but cannot eliminate, the chance for an individual to have a child with CF.  If an individual is found to be a carrier for CF, then carrier testing would be available for the partner. As part of Stacey Dunn's newborn screening profile, all babies born in the state of [place] will have a two-tier screening process.  Specimens are first tested to determine the concentration of immunoreactive trypsinogen (IRT).  The top 5% of specimens with the highest IRT values then undergo DNA testing using a panel of over 40 common CF mutations. SMA is a neurodegenerative disorder that leads to atrophy of skeletal muscle and overall weakness.  This condition is  also more prevalent in the Caucasian population, with 1 in 40-1 in 60 persons being a carrier and 1 in 6,000-1 in 10,000 children being affected.  There are multiple forms of the disease, with some causing death in infancy to other forms with survival into adulthood.  The genetics of SMA is complex, but carrier screening can detect up to 95% of carriers in the Caucasian population.  Similar to CF, a negative result can greatly reduce, but cannot eliminate, the chance to have a child with SMA. The patient and her partner are of Caucasian background.  We also offered the option of hemoglobinopathy screening.  We obtained a detailed family history and pregnancy history.  Stacey Dunn reported a personal and family history of mental health conditions including depression, bipolar and borderline personality disorder in herself.  We discussed that mental health diagnoses may have complex factors including genetic components as well as situational factors.  This pregnancy is likely at an increased risk for developing a mental health condition, though the specific chance may be difficult to determine.  She also reported multiple family members with learning difficulties including her daughter, her sister, nephew and a maternal first cousin.  Her sister also has a seizure disorder for which she  takes multiple medications, a history of substance abuse and the developmental level of "a 36 year old." The family was told that her epilepsy was the result of lack of oxygen in utero, and if this is the case, then we would not expect an increased risk for seizures in other family members.  If not, there could be an increased risk. This sister's son, Stacey Dunn, is now 36 years old and has numerous medical and developmental concerns including severe mood swings, learning delays, Tourette syndrome, Mongolian spots and tracheomalacia.  The family has attributed Stacey Dunn's health concerns to in utero exposure to recreational drugs and numerous  seizures medications taken by his mother.  While there exposures may have an effect on fetal development, some of his symptoms could be present as part of certain genetic syndromes.  Stacey Dunn was going to speak with her mother (who has custody of Stacey Dunn) to find out if he has been evaluated by a Medical Geneticist.  If not, it may be reasonable to consider scheduling a Medical Genetics Consultation for him. Lastly, the patient stated that her maternal uncle has two sons, one with a diagnosis of autism and one who is thought to also be affected but who has thus far only been diagnosed with learning delays.  In general, we spoke about autism, learning differences and intellectual disabilities.  There may be many causes for learning differences as well as autism including some that are inherited and others that are not.  We talked about testing for Fragile X syndrome as one possible cause, but that without a known diagnosis in any of these individuals, a specific risk estimate would be difficult.  The patient declined screening for Fragile X and we encouraged her to let us know if more information is learned. In addition, Stacey Dunn and his maternal grandfather were both said to have scoliosis.  This condition is most likely multifactorial and may have a recurrence risk of approximately 5% in first degree relatives.  The remainder of the family history is unremarkable for birth defects, developmental delays, recurrent pregnancy loss or known chromosome abnormalities.  Stacey Dunn stated that this is her second pregnancy, the first with her current partner.  She reported one episode of alcohol use prior to learning that she was pregnant, well within the all or none period.  She also was smoking marijuana daily until approximately 8-[redacted] weeks gestation.  Prior to learning she was pregnant, Stacey Dunn was also smoking 2-3 packs of cigarettes per day but she has cut back to 4-5 cigarettes per day.  The use of both tobacco  and marijuana in pregnancy is known to be associated with low birth weight, premature delivery and poor pregnancy outcomes.  We therefore suggested the patient avoid smoking smoking for the remainder of the pregnancy. 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  While it seems as though the amount of exposure is likely to be minimal, we are happy to discuss each of these exposures in more detail if she is able to provide the specific chemicals involved. The patient stated that her workplace is doing a good job of minimizing her exposures during the pregnancy.  After consideration of the options, Stacey Dunn elected to proceed with MaterniT21 PLUS with SCA testing as well as CF, SMA and hemoglobinopathy carrier screening.  An ultrasound was performed at the time of the visit.  The gestational age was consistent with 13 weeks.  Fetal anatomy could not be assessed due to early gestational age.  Please refer to the ultrasound report for details of that study.  Stacey Dunn was encouraged to call with questions or concerns.  We can be contacted at [phone].   Tests Ordered:  MaterniT21 PLUS with SCA, CF, SMA and hemoglobinopathy screening     Cherly Anderson, MS, CGC  Katrina Stack, MS, CGC performed an integral service incident to the physician's initial service.  I was physically present in the clinical area and was immediately available to render assistance.   Davin Muramoto C Auriana Scalia

## 2018-11-23 LAB — HEMOGLOBINOPATHY EVALUATION
HGB F QUANT: 0 % (ref 0.0–2.0)
Hgb A2 Quant: 2.6 % (ref 1.8–3.2)
Hgb A: 97.4 % (ref 96.4–98.8)
Hgb C: 0 %
Hgb S Quant: 0 %
Hgb Variant: 0 %

## 2018-11-26 LAB — MATERNIT21 PLUS CORE+SCA
CHROMOSOME 21: NEGATIVE
Chromosome 13: NEGATIVE
Chromosome 18: NEGATIVE
Fetal Fraction: 6
Y CHROMOSOME: NOT DETECTED

## 2018-11-28 ENCOUNTER — Telehealth: Payer: Self-pay | Admitting: Genetics

## 2018-11-28 NOTE — Telephone Encounter (Signed)
The patient was informed of the results of her recent MaterniT21 testing which yielded NEGATIVE results.  The patient's specimen showed DNA consistent with two copies of chromosomes 21, 18 and 13.  The sensitivity for trisomy 74, trisomy 58 and trisomy 5 using this testing are reported as 99.1%, 99.9% and 91.7% respectively.  Thus, while the results of this testing are highly accurate, they are not considered diagnostic at this time.  Should more definitive information be desired, the patient may still consider amniocentesis.   As requested to know by the patient, sex chromosome analysis was included for this sample.  Results are consistent with a female fetus. This is predicted with >99% accuracy.  A maternal serum AFP only should be considered if screening for neural tube defects is desired.  We may be reached at [phone] with any questions or concerns.

## 2018-11-28 NOTE — Telephone Encounter (Signed)
Hemoglobinopathy evaluation also returned with normal adult hemoglobin.  The chance for Stacey Dunn to be a carrier of a hemoglobinopathy is significantly reduced.  No further testing is indicated for her partner at this time.  CF and SMA carrier screens are still in process.

## 2018-11-29 LAB — CYSTIC FIBROSIS GENE TEST

## 2018-11-29 LAB — SMN1 COPY NUMBER ANALYSIS (SMA CARRIER SCREENING)

## 2018-12-01 ENCOUNTER — Telehealth: Payer: Self-pay | Admitting: Obstetrics and Gynecology

## 2018-12-01 NOTE — Telephone Encounter (Signed)
  We are pleased to inform Ms. Stacey Dunn that the results of the recent screening tests for Cystic fibrosis (CF) and Spinal muscular atrophy (SMA) are now available.    To review, CF is a genetic condition that occurs most often in Caucasian persons.  It primarily affects the lungs, digestive, and reproductive systems.  For someone to be at risk for having CF, both of their parents must be carriers for CF.  The testing can detect many persons who are carriers for CF and therefore determine if the pregnancy is at an increased risk for this condition.  The blood test results were negative when examined for the 32 most common mutations (or changes) in the gene for CF.  This means that she does not carry any of the most common changes in this gene.  Testing for these 32 mutations detects approximately 90% of carriers who are Caucasian.  Therefore, the chance that she is a carrier based on this negative result has been reduced from 1 in 25 to approximately 1 in 240.  Because this testing cannot detect all changes that may cause CF, we cannot eliminate the chance that this individual is a carrier completely.  The results of the SMA carrier screening are also available.  SMA is also a recessive genetic condition with variable age of onset and severity caused by mutations in the SMN1 gene.  This carrier testing assesses the number of copies of this gene.  Persons with one copy of the SMN1 gene are carriers, and those with no copies are affected with the condition.  Individuals with two or more copies have a reduced chance to be a carrier.  Not all mutations can be detected with this testing, though it can detect 94.8% of carriers in the Caucasian population.  The results revealed that Ms. Stacey Dunn has an SMN1 copy number of 2, thus reducing her chance to be a carrier from 1 in 89 to 1 in 834.  Again, this testing cannot eliminate the chance to have a child with SMA, but dramatically reduces the chance.    We encouraged  the patient to call with any questions or concerns as they arise.  We may be reached at [phone].  Stacey Anderson, MS, CGC

## 2018-12-19 ENCOUNTER — Other Ambulatory Visit: Payer: Self-pay

## 2018-12-19 DIAGNOSIS — O09522 Supervision of elderly multigravida, second trimester: Secondary | ICD-10-CM

## 2018-12-21 ENCOUNTER — Other Ambulatory Visit (HOSPITAL_COMMUNITY): Payer: Self-pay | Admitting: Family Medicine

## 2018-12-21 LAB — URINALYSIS
Bilirubin, UA: NEGATIVE
Glucose, UA: NEGATIVE
Ketones, UA: NEGATIVE
Nitrite, UA: NEGATIVE
Protein,UA: NEGATIVE
RBC, UA: NEGATIVE
Specific Gravity, UA: 1.025 (ref 1.005–1.030)
Urobilinogen, Ur: 0.2 mg/dL (ref 0.2–1.0)
pH, UA: 6 (ref 5.0–7.5)

## 2018-12-22 LAB — 789231 7+OXYCODONE-BUND
Amphetamines, Urine: NEGATIVE ng/mL
BENZODIAZ UR QL: NEGATIVE ng/mL
Barbiturate screen, urine: NEGATIVE ng/mL
Cannabinoid Quant, Ur: NEGATIVE ng/mL
Cocaine (Metab.): NEGATIVE ng/mL
OPIATE SCREEN URINE: NEGATIVE ng/mL
Oxycodone/Oxymorphone, Urine: NEGATIVE ng/mL
PCP Quant, Ur: NEGATIVE ng/mL

## 2018-12-29 ENCOUNTER — Ambulatory Visit
Admission: RE | Admit: 2018-12-29 | Discharge: 2018-12-29 | Disposition: A | Discharge status: Home / Self Care | Payer: Medicaid Other | Source: Ambulatory Visit | Attending: Maternal & Fetal Medicine | Admitting: Maternal & Fetal Medicine

## 2018-12-29 ENCOUNTER — Other Ambulatory Visit: Payer: Self-pay

## 2018-12-29 DIAGNOSIS — O09522 Supervision of elderly multigravida, second trimester: Secondary | ICD-10-CM | POA: Insufficient documentation

## 2018-12-29 DIAGNOSIS — Z3A18 18 weeks gestation of pregnancy: Secondary | ICD-10-CM | POA: Insufficient documentation

## 2019-03-08 LAB — OB RESULTS CONSOLE RPR: RPR: NONREACTIVE

## 2019-03-08 LAB — OB RESULTS CONSOLE HGB/HCT, BLOOD: Hemoglobin: 13.3

## 2019-03-08 LAB — OB RESULTS CONSOLE HIV ANTIBODY (ROUTINE TESTING): HIV: NONREACTIVE

## 2019-03-15 DIAGNOSIS — F603 Borderline personality disorder: Secondary | ICD-10-CM | POA: Insufficient documentation

## 2019-03-15 DIAGNOSIS — F1210 Cannabis abuse, uncomplicated: Secondary | ICD-10-CM | POA: Insufficient documentation

## 2019-03-15 DIAGNOSIS — E669 Obesity, unspecified: Secondary | ICD-10-CM | POA: Insufficient documentation

## 2019-03-15 DIAGNOSIS — Z8659 Personal history of other mental and behavioral disorders: Secondary | ICD-10-CM

## 2019-03-15 DIAGNOSIS — O0993 Supervision of high risk pregnancy, unspecified, third trimester: Secondary | ICD-10-CM

## 2019-03-15 DIAGNOSIS — F17210 Nicotine dependence, cigarettes, uncomplicated: Secondary | ICD-10-CM | POA: Insufficient documentation

## 2019-03-15 DIAGNOSIS — Z8759 Personal history of other complications of pregnancy, childbirth and the puerperium: Secondary | ICD-10-CM

## 2019-03-15 DIAGNOSIS — O34219 Maternal care for unspecified type scar from previous cesarean delivery: Secondary | ICD-10-CM | POA: Insufficient documentation

## 2019-03-22 ENCOUNTER — Ambulatory Visit: Payer: Self-pay

## 2019-03-23 ENCOUNTER — Telehealth: Payer: Self-pay

## 2019-03-23 NOTE — Telephone Encounter (Signed)
Phone call to patient to reschedule 03/22/2019 missed MH RV appt. Spoke with patient. MH RV scheduled for 04/03/2019 @ 1:20. Instructed patient to be here at 1:00 for check in.

## 2019-04-01 DIAGNOSIS — E669 Obesity, unspecified: Secondary | ICD-10-CM | POA: Insufficient documentation

## 2019-04-02 DIAGNOSIS — Z6791 Unspecified blood type, Rh negative: Secondary | ICD-10-CM

## 2019-04-03 ENCOUNTER — Other Ambulatory Visit: Payer: Self-pay

## 2019-04-03 ENCOUNTER — Ambulatory Visit: Payer: Medicaid Other | Admitting: Nurse Practitioner

## 2019-04-03 DIAGNOSIS — Z8659 Personal history of other mental and behavioral disorders: Secondary | ICD-10-CM

## 2019-04-03 DIAGNOSIS — F603 Borderline personality disorder: Secondary | ICD-10-CM

## 2019-04-03 DIAGNOSIS — O26899 Other specified pregnancy related conditions, unspecified trimester: Secondary | ICD-10-CM

## 2019-04-03 DIAGNOSIS — F1210 Cannabis abuse, uncomplicated: Secondary | ICD-10-CM

## 2019-04-03 DIAGNOSIS — F17210 Nicotine dependence, cigarettes, uncomplicated: Secondary | ICD-10-CM

## 2019-04-03 DIAGNOSIS — Z6791 Unspecified blood type, Rh negative: Secondary | ICD-10-CM

## 2019-04-03 DIAGNOSIS — E669 Obesity, unspecified: Secondary | ICD-10-CM

## 2019-04-03 DIAGNOSIS — Z8759 Personal history of other complications of pregnancy, childbirth and the puerperium: Secondary | ICD-10-CM

## 2019-04-03 DIAGNOSIS — O0993 Supervision of high risk pregnancy, unspecified, third trimester: Secondary | ICD-10-CM

## 2019-04-03 NOTE — Progress Notes (Signed)
Nursing Staff Provider  Office Location  [facility] Dating    Language  English Anatomy US    Flu Vaccine   Genetic Screen  NIPS:   AFP:   First Screen:  Quad:    TDaP vaccine    Hgb A1C or  GTT Early  Third trimester   Rhogam     LAB RESULTS   Feeding Plan  Blood Type O/Negative/O negative (01/28 0000)   Contraception  Antibody    Circumcision  Rubella    Pediatrician   RPR Nonreactive (06/17 0000)   Support Person  HBsAg Negative (01/28 0000)   Prenatal Classes  HIV Non-reactive (06/17 0000)  BTL Consent  GBS  (For PCN allergy, check sensitivities)   VBAC Consent  Pap      Hgb Electro      CF   Delivery Group   SMA   Centering Group        PRENATAL VISIT NOTE  Subjective:  Stacey Dunn is a 38 y.o. G2P1001 at 347w3d being seen today for ongoing prenatal care.  She is currently monitored for the following issues for this high-risk pregnancy and has Advanced maternal age in multigravida, first trimester; Obesity, Class I, BMI 30-34.9; History of bipolar disorder; Borderline personality disorder (HCC); Cannabis abuse; Cigarette smoker; Delivery by cesarean section of full-term infant; History of pregnancy induced hypertension; Supervision of high risk pregnancy in third trimester; Obesity; and Rh negative, antepartum on their problem list.  Patient reports no complaints.   .  .   . denies vomiting, abdominal pain, fussiness, diarrhea, cough and difficulty breathing leaking of fluid/ROM.   The following portions of the patient's history were reviewed and updated as appropriate: allergies, current medications, past family history, past medical history, past social history, past surgical history and problem list. Problem list updated.  Objective:   Vitals:   04/03/19 1343  BP: 119/73  Temp: 97.9 F (36.6 C)  Weight: 219 lb (99.3 kg)    Fetal Status:           General:  Alert, oriented and cooperative. Patient is in no acute distress.  Skin: Skin is warm and dry. No rash  noted.   Cardiovascular: Normal heart rate noted  Respiratory: Normal respiratory effort, no problems with respiration noted  Abdomen: Soft, gravid, appropriate for gestational age.        Pelvic: Cervical exam deferred        Extremities: Normal range of motion.     Mental Status: Normal mood and affect. Normal behavior. Normal judgment and thought content.   Assessment and Plan:  Pregnancy: G2P1001 at 687w3d  1. Rh negative, antepartum RhoGam to be given at delivery  2. Obesity, Class I, BMI 30-34.9 45 lb (20.4 kg)  3. Delivery by cesarean section of full-term infant Discussed Planned C-section - wants to know more information   4. Supervision of high risk pregnancy in third trimester Doing well   Preterm labor symptoms and general obstetric precautions including but not limited to vaginal bleeding, contractions, leaking of fluid and fetal movement were reviewed in detail with the patient. Please refer to After Visit Summary for other counseling recommendations.  No follow-ups on file.  No future appointments.  Donn PieriniKarla W Aryel Edelen, NP    STI clinic/screening visit  Subjective:  Stacey Dunn is a 38 y.o. female being seen today for an STI screening visit. The patient reports they do not have symptoms.  Patient has the following medical conditionshas Advanced maternal  age in multigravida, first trimester; Obesity, Class I, BMI 30-34.9; History of bipolar disorder; Borderline personality disorder ([place]); Cannabis abuse; Cigarette smoker; Delivery by cesarean section of full-term infant; History of pregnancy induced hypertension; Supervision of high risk pregnancy in third trimester; Obesity; and Rh negative, antepartum on their problem list.  Chief Complaint  Patient presents with  . Routine Prenatal Visit    32 wks 3 days gestation    Patient reports  HPI   See flowsheet for further details and programmatic requirements.    The following portions of the patient's  history were reviewed and updated as appropriate: allergies, current medications, past family history, past medical history, past social history, past surgical history and problem list. Problem list updated.  Objective:   Vitals:   04/03/19 1343  BP: 119/73  Temp: 97.9 F (36.6 C)  Weight: 219 lb (99.3 kg)    Physical Exam    Assessment and Plan:  Stacey Dunn is a 38 y.o. female presenting to the [facility] Department

## 2019-04-03 NOTE — Progress Notes (Signed)
Patient here for maternity revisit at 32 3/7. Kick counts reviewed and cards given. Patient denies all covid 19 questions and symptoms. Patient needs delivery plans appointment referral done today.Jenetta Downer, RN

## 2019-04-14 LAB — GLUCOSE, 1 HOUR GESTATIONAL: Gestational Diabetes Screen: 101

## 2019-04-17 ENCOUNTER — Other Ambulatory Visit: Payer: Self-pay

## 2019-04-17 ENCOUNTER — Ambulatory Visit: Payer: Medicaid Other

## 2019-04-17 ENCOUNTER — Ambulatory Visit: Payer: Medicaid Other | Admitting: Family Medicine

## 2019-04-17 DIAGNOSIS — F17210 Nicotine dependence, cigarettes, uncomplicated: Secondary | ICD-10-CM

## 2019-04-17 DIAGNOSIS — F1210 Cannabis abuse, uncomplicated: Secondary | ICD-10-CM

## 2019-04-17 DIAGNOSIS — E66811 Obesity, class 1: Secondary | ICD-10-CM

## 2019-04-17 DIAGNOSIS — Z8759 Personal history of other complications of pregnancy, childbirth and the puerperium: Secondary | ICD-10-CM

## 2019-04-17 DIAGNOSIS — F603 Borderline personality disorder: Secondary | ICD-10-CM

## 2019-04-17 DIAGNOSIS — O99210 Obesity complicating pregnancy, unspecified trimester: Secondary | ICD-10-CM | POA: Insufficient documentation

## 2019-04-17 DIAGNOSIS — E669 Obesity, unspecified: Secondary | ICD-10-CM

## 2019-04-17 DIAGNOSIS — Z6791 Unspecified blood type, Rh negative: Secondary | ICD-10-CM

## 2019-04-17 DIAGNOSIS — O34219 Maternal care for unspecified type scar from previous cesarean delivery: Secondary | ICD-10-CM

## 2019-04-17 DIAGNOSIS — Z8659 Personal history of other mental and behavioral disorders: Secondary | ICD-10-CM

## 2019-04-17 DIAGNOSIS — O26899 Other specified pregnancy related conditions, unspecified trimester: Secondary | ICD-10-CM | POA: Diagnosis not present

## 2019-04-17 DIAGNOSIS — O0993 Supervision of high risk pregnancy, unspecified, third trimester: Secondary | ICD-10-CM

## 2019-04-17 NOTE — Progress Notes (Signed)
Confirmed pt identity-is at home address; discussed  Limitations of visit, and is a billable visit-agrees to continue; reports more frequent Braxton Hicks contractions and some edema in feet/hands; aware will need delivery plans appt at [facility], RN

## 2019-04-17 NOTE — Progress Notes (Signed)
   TELEPHONE OBSTETRICS VISIT ENCOUNTER NOTE  I connected with@ on 04/17/19 at  1:20 PM EDT by telephone at home and verified that I am speaking with the correct person using two identifiers.   I discussed the limitations, risks, security and privacy concerns of performing an evaluation and management service by telephone and the availability of in person appointments. I also discussed with the patient that there may be a patient responsible charge related to this service. The patient expressed understanding and agreed to proceed.  Subjective:  Stacey Dunn is a 38 y.o. G2P1001 at [redacted]w[redacted]d being followed for ongoing prenatal care.  She is currently monitored for the following issues for this high-risk pregnancy and has AMA (advanced maternal age) multigravida 35+; Obesity, Class I, BMI 30-34.9; History of bipolar disorder; Borderline personality disorder ([place]); Cannabis abuse; Cigarette smoker; Previous cesarean delivery affecting pregnancy, antepartum; History of pregnancy induced hypertension; Supervision of high risk pregnancy in third trimester; Rh negative, antepartum; and Obesity affecting pregnancy, antepartum on their problem list.  Patient reports Edema- in bilateral lower extermity swelling. Reports fetal movement. Denies any contractions, bleeding or leaking of fluid.   The following portions of the patient's history were reviewed and updated as appropriate: allergies, current medications, past family history, past medical history, past social history, past surgical history and problem list.   Objective:   General:  Alert, oriented and cooperative.   Mental Status: Normal mood and affect perceived. Normal judgment and thought content.  Rest of physical exam deferred due to type of encounter  Assessment and Plan:  Pregnancy: G2P1001 at [redacted]w[redacted]d 1. Rh negative, antepartum Received rhogam  2. History of bipolar disorder Stable, recommend 2 wk mood check  3. Borderline personality  disorder ([place]) See above  4. Cannabis abuse Quit!  5. Cigarette smoker Smoking 1-2cpd.  Before pregnancy was smoking 1.5-2ppd.   6. Previous cesarean delivery affecting pregnancy, antepartum - Delivery plans appt ordered  7. History of pregnancy induced hypertension BP wnl today  8. Supervision of high risk pregnancy in third trimester UTD currently Reviewed next appt and plan of care Recommended increasing water intake and checking BP at least weekly  9. Obesity affecting pregnancy, antepartum TWG=49 lb 9.6 oz (22.5 kg) which is well above goal based on her weight.   Preterm labor symptoms and general obstetric precautions including but not limited to vaginal bleeding, contractions, leaking of fluid and fetal movement were reviewed in detail with the patient.  I discussed the assessment and treatment plan with the patient. The patient was provided an opportunity to ask questions and all were answered. The patient agreed with the plan and demonstrated an understanding of the instructions. The patient was advised to call back or seek an in-person office evaluation/go to the hospital for any urgent or concerning symptoms.  Please refer to After Visit Summary for other counseling recommendations.   I provided 17 minutes of non-face-to-face time during this encounter.  Return in about 2 weeks (around 05/01/2019) for Routine prenatal care, 36wks.  Future Appointments  Date Time Provider [place]  05/01/2019  1:40 PM [facility] PROVIDER [facility] None    Caren Macadam, MD

## 2019-04-19 ENCOUNTER — Telehealth: Payer: Self-pay | Admitting: Obstetrics and Gynecology

## 2019-04-19 NOTE — Telephone Encounter (Signed)
[facility] referring for Delivery plans appointment needed, prior LTCS for failure to progress. Desire repeat LTCS and we discussed BTL and patient does not want this but does not wan more children either. She is open to discussing with surgical team. Attempt to reach patient voicemail box not set up unable to leave message to call back to schedule

## 2019-04-24 ENCOUNTER — Other Ambulatory Visit: Payer: Self-pay

## 2019-04-24 ENCOUNTER — Encounter: Payer: Self-pay | Admitting: Obstetrics and Gynecology

## 2019-04-24 ENCOUNTER — Ambulatory Visit (INDEPENDENT_AMBULATORY_CARE_PROVIDER_SITE_OTHER): Payer: Medicaid Other | Admitting: Obstetrics and Gynecology

## 2019-04-24 VITALS — BP 110/70 | HR 97 | Ht 64.0 in | Wt 229.0 lb

## 2019-04-24 DIAGNOSIS — O0993 Supervision of high risk pregnancy, unspecified, third trimester: Secondary | ICD-10-CM | POA: Diagnosis not present

## 2019-04-24 DIAGNOSIS — Z98891 History of uterine scar from previous surgery: Secondary | ICD-10-CM

## 2019-04-24 NOTE — Progress Notes (Signed)
Patient ID: Stacey PyleAmber N Dunn, female   DOB: 03-31-1981, 38 y.o.   MRN: [id number]  Reason for Consult: Pregnancy Plan (Swelling in extremities, and numbness in fingers)   Referred by Federico FlakeNewton, Kimberly Niles,*  Subjective:       Stacey PyleAmber N Dunn is an 38 y.o. female.  HPI: She is receiving her prenatal care with the health department and is here today for a delivery consultation. She is otherwise feeling good. No complaints. Normal fetal movement.    Past Medical History:  Diagnosis Date  . 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 Bipolar 2 disorder (HCC)    no meds currently  . Caries   . Constipation   . Depression    History -no meds  . History of borderline personality disorder    no meds currently  . History of chest pain    Per client, [facility] - [facility]. "Nodules right lung."  No follow-up by client.  . Hypertension    History "PIH"  . Migraine    History  . Nausea & vomiting    associated with pregnancy  . Varicosities of leg   . Wears partial dentures    Upper    Past Surgical History:  Procedure Laterality Date  . ANKLE SURGERY Right    For Staph infection  . CESAREAN SECTION      Family History  Problem Relation Age of Onset  . Kidney disease Mother   . Renal Disease Mother   . Hypertension Mother   . Alcohol abuse Father   . Hypertension Sister   . Cancer Sister   . Heart Problems Sister   . Cancer Maternal Uncle   . Cancer Maternal Grandfather   . Cancer Paternal Grandfather   . Diabetes Paternal Grandfather   . Epilepsy Sister   . Migraines Sister     Social History:  reports that she has been smoking cigarettes. She has a 5.25 pack-year smoking history. She has never used smokeless tobacco. She reports previous alcohol use. She reports current drug use. Drug: Marijuana.  Allergies:  Allergies  Allergen Reactions  . Meloxicam Swelling  . Morphine And Related   .  Penicillins     hives  . Morphine Hives, Other (See Comments) and Rash    hallucinations   . Sulfa Antibiotics Hives, Other (See Comments) and Rash    unknown     Medications: I have reviewed the patient's current medications.  No results found for this or any previous visit (from the past 48 hour(s)).  No results found.  Review of Systems  Constitutional: Negative for chills, fever, malaise/fatigue and weight loss.  HENT: Negative for congestion, hearing loss and sinus pain.   Eyes: Negative for blurred vision and double vision.  Respiratory: Negative for cough, sputum production, shortness of breath and wheezing.   Cardiovascular: Negative for chest pain, palpitations, orthopnea and leg swelling.  Gastrointestinal: Negative for abdominal pain, constipation, diarrhea, nausea and vomiting.  Genitourinary: Negative for dysuria, flank pain, frequency, hematuria and urgency.  Musculoskeletal: Negative for back pain, falls and joint pain.  Skin: Negative for itching and rash.  Neurological: Negative for dizziness and headaches.  Psychiatric/Behavioral: Negative for depression, substance abuse and suicidal ideas. The patient is not nervous/anxious.    Blood pressure 110/70, pulse 97, height 5\' 4"  (1.626 m), weight 229 lb (103.9 kg), last menstrual period 08/21/2018. Physical Exam  Nursing note and vitals reviewed. Constitutional: She is oriented to person, place, and time. She appears well-developed and well-nourished.  HENT:  Head: Normocephalic and atraumatic.  Cardiovascular: Normal rate and regular rhythm.  Respiratory: Effort normal and breath sounds normal.  GI: Soft. Bowel sounds are normal.  Musculoskeletal: Normal range of motion.  Neurological: She is alert and oriented to person, place, and time.  Skin: Skin is warm and dry.  Psychiatric: She has a normal mood and affect. Her behavior is normal. Judgment and thought content normal.   FHR: 130s FH: 37cm   Assessment/Plan:  Will plan for cesarean at 39 weeks. Cesarean may need to be moved to a sooner date if she has complications with preeclampsia or diabetes.  Cesarean pending approval on 05/23/2019. Patient does not desire tubal ligation. Reports her partner is planning on having a vasectomy.  She was counseled on her options for VBAC versus RLTCS and desires an RLTCS Fundal height > GA. Hx of fetal macrosomia, would recommend a growth [place].   More than 25 minutes were spent face to face with the patient in the room with more than 50% of the time spent providing counseling and discussing the plan of management.     R  04/24/2019, 12:56 PM

## 2019-04-28 ENCOUNTER — Telehealth: Payer: Self-pay | Admitting: Obstetrics and Gynecology

## 2019-04-28 NOTE — Telephone Encounter (Signed)
Patient is aware of H&P at [facility] on 05/15/19 @ 11:10am, Pre-admit testing to be scheduled, Covid testing on Friday, 05/19/19 @ 9-10am, and OR on 05/23/19.

## 2019-04-28 NOTE — Telephone Encounter (Signed)
-----   Message from [name], MD sent at 04/24/2019  6:00 PM EDT ----- Surgery Booking Request Patient Full Name:  ARYONA SILL  MRN: [id number]  DOB: 1981/08/09  Surgeon: [name], MD  Requested Surgery Date and Time: 05/23/2019 Primary Diagnosis AND Code: repeat cesarean section Secondary Diagnosis and Code:  Surgical Procedure: Cesarean Section L&D Notification: Yes Admission Status: surgery admit Length of Surgery: 1 hour Special Case Needs: none H&P: TBD (date) Phone Interview???: yes Interpreter: Language:  Medical Clearance: no Special Scheduling Instructions: Pending L&D approval for 3rd case Acuity: P3

## 2019-05-01 ENCOUNTER — Ambulatory Visit: Payer: Medicaid Other | Admitting: Nurse Practitioner

## 2019-05-01 ENCOUNTER — Other Ambulatory Visit: Payer: Self-pay

## 2019-05-01 DIAGNOSIS — O09523 Supervision of elderly multigravida, third trimester: Secondary | ICD-10-CM

## 2019-05-01 DIAGNOSIS — O99210 Obesity complicating pregnancy, unspecified trimester: Secondary | ICD-10-CM

## 2019-05-01 DIAGNOSIS — O34219 Maternal care for unspecified type scar from previous cesarean delivery: Secondary | ICD-10-CM

## 2019-05-01 DIAGNOSIS — F603 Borderline personality disorder: Secondary | ICD-10-CM

## 2019-05-01 NOTE — Progress Notes (Signed)
   PRENATAL VISIT NOTE  Subjective:  Stacey Dunn is a 38 y.o. G2P1001 at [redacted]w[redacted]d being seen today for ongoing prenatal care.  She is currently monitored for the following issues for this high-risk pregnancy and has AMA (advanced maternal age) multigravida 35+; Obesity, Class I, BMI 30-34.9; History of bipolar disorder; Borderline personality disorder ([place]); Cannabis abuse; Cigarette smoker; Previous cesarean delivery affecting pregnancy, antepartum; History of pregnancy induced hypertension; Supervision of high risk pregnancy in third trimester; Rh negative, antepartum; and Obesity affecting pregnancy, antepartum on their problem list.  Patient reports backache.  Contractions: Not present. Vag. Bleeding: None.  Movement: Present. Denies leaking of fluid/ROM.   The following portions of the patient's history were reviewed and updated as appropriate: allergies, current medications, past family history, past medical history, past social history, past surgical history and problem list. Problem list updated.  Objective:   Vitals:   05/01/19 1334  BP: 128/79  Temp: 97.8 F (36.6 C)  Weight: 228 lb 6.4 oz (103.6 kg)    Fetal Status: Fetal Heart Rate (bpm): 138 Fundal Height: 40 cm Movement: Present  Presentation: Undeterminable  General:  Alert, oriented and cooperative. Patient is in no acute distress.  Skin: Skin is warm and dry. No rash noted.   Cardiovascular: Normal heart rate noted  Respiratory: Normal respiratory effort, no problems with respiration noted  Abdomen: Soft, gravid, appropriate for gestational age.  Pain/Pressure: Absent     Pelvic: Cervical exam deferred        Extremities: Normal range of motion.  Edema: Trace  Mental Status: Normal mood and affect. Normal behavior. Normal judgment and thought content.   Assessment and Plan:  Pregnancy: G2P1001 at [redacted]w[redacted]d  1. Obesity affecting pregnancy, antepartum Measurements greater than dates - unsure of vertex position as  well - US OB DETAIL + 14 WK; Future  2. Borderline personality disorder ([place]) PHQ 9 score 4 - declines services at this time Continue to monitor  3. Previous cesarean delivery affecting pregnancy, antepartum Scheduled C-section for - 05/23/2019 at [facility]  4. Multigravida of advanced maternal age in third trimester Client doing well Self collected the following samples: - Strep Gp B NAA+Rflx - Chlamydia/GC NAA, Confirmation Await results  Client verbalizes understanding and is in agreement with plan of care   Preterm labor symptoms and general obstetric precautions including but not limited to vaginal bleeding, contractions, leaking of fluid and fetal movement were reviewed in detail with the patient. Please refer to After Visit Summary for other counseling recommendations.  Return in about 1 week (around 05/08/2019) for routine prenatal care - check measurements and review [place] results.  Future Appointments  Date Time Provider [place]  05/08/2019  1:20 PM [facility] PROVIDER [facility] None  05/15/2019 11:10 AM Gilman Schmidt, Stefanie Libel, MD [facility] None  05/17/2019  8:00 AM [facility] None    Berniece Andreas, NP

## 2019-05-01 NOTE — Progress Notes (Signed)
In for visit; self collection of cultures today Debera Lat, RN

## 2019-05-02 ENCOUNTER — Telehealth: Payer: Self-pay

## 2019-05-02 NOTE — Telephone Encounter (Signed)
Call to pt-informed of 05/04/19 @ 11:00 [facility] u/s-instructions given Debera Lat, RN

## 2019-05-03 LAB — STREP GP B NAA+RFLX: Strep Gp B NAA+Rflx: NEGATIVE

## 2019-05-04 ENCOUNTER — Other Ambulatory Visit: Payer: Self-pay

## 2019-05-04 ENCOUNTER — Ambulatory Visit (INDEPENDENT_AMBULATORY_CARE_PROVIDER_SITE_OTHER): Payer: Medicaid Other

## 2019-05-04 DIAGNOSIS — Z3A36 36 weeks gestation of pregnancy: Secondary | ICD-10-CM | POA: Diagnosis not present

## 2019-05-04 DIAGNOSIS — O403XX0 Polyhydramnios, third trimester, not applicable or unspecified: Secondary | ICD-10-CM

## 2019-05-04 DIAGNOSIS — O99210 Obesity complicating pregnancy, unspecified trimester: Secondary | ICD-10-CM

## 2019-05-05 LAB — CHLAMYDIA/GC NAA, CONFIRMATION
Chlamydia trachomatis, NAA: NEGATIVE
Neisseria gonorrhoeae, NAA: NEGATIVE

## 2019-05-08 ENCOUNTER — Ambulatory Visit: Payer: Medicaid Other | Admitting: Family Medicine

## 2019-05-08 ENCOUNTER — Other Ambulatory Visit: Payer: Self-pay

## 2019-05-08 ENCOUNTER — Encounter: Payer: Self-pay | Admitting: Family Medicine

## 2019-05-08 DIAGNOSIS — F17210 Nicotine dependence, cigarettes, uncomplicated: Secondary | ICD-10-CM

## 2019-05-08 DIAGNOSIS — Z6791 Unspecified blood type, Rh negative: Secondary | ICD-10-CM

## 2019-05-08 DIAGNOSIS — F1210 Cannabis abuse, uncomplicated: Secondary | ICD-10-CM

## 2019-05-08 DIAGNOSIS — O99210 Obesity complicating pregnancy, unspecified trimester: Secondary | ICD-10-CM

## 2019-05-08 DIAGNOSIS — O09523 Supervision of elderly multigravida, third trimester: Secondary | ICD-10-CM

## 2019-05-08 DIAGNOSIS — O26899 Other specified pregnancy related conditions, unspecified trimester: Secondary | ICD-10-CM

## 2019-05-08 DIAGNOSIS — Z8759 Personal history of other complications of pregnancy, childbirth and the puerperium: Secondary | ICD-10-CM

## 2019-05-08 DIAGNOSIS — O34219 Maternal care for unspecified type scar from previous cesarean delivery: Secondary | ICD-10-CM

## 2019-05-08 DIAGNOSIS — O0993 Supervision of high risk pregnancy, unspecified, third trimester: Secondary | ICD-10-CM

## 2019-05-08 DIAGNOSIS — Z8659 Personal history of other mental and behavioral disorders: Secondary | ICD-10-CM

## 2019-05-08 NOTE — Progress Notes (Signed)
In for visit; taking PNV Harlo Jaso, RN  

## 2019-05-08 NOTE — Patient Instructions (Signed)
Hypertension During Pregnancy °Hypertension is also called high blood pressure. High blood pressure means that the force of your blood moving in your body is too strong. It can cause problems for you and your baby. Different types of high blood pressure can happen during pregnancy. The types are: °· High blood pressure before you got pregnant. This is called chronic hypertension.  This can continue during your pregnancy. Your doctor will want to keep checking your blood pressure. You may need medicine to keep your blood pressure under control while you are pregnant. You will need follow-up visits after you have your baby. °· High blood pressure that goes up during pregnancy when it was normal before. This is called gestational hypertension. It will usually get better after you have your baby, but your doctor will need to watch your blood pressure to make sure that it is getting better. °· Very high blood pressure during pregnancy. This is called preeclampsia. Very high blood pressure is an emergency that needs to be checked and treated right away. °· You may develop very high blood pressure after giving birth. This is called postpartum preeclampsia. This usually occurs within 48 hours after childbirth but may occur up to 6 weeks after giving birth. This is rare. °How does this affect me? °If you have high blood pressure during pregnancy, you have a higher chance of developing high blood pressure: °· As you get older. °· If you get pregnant again. °In some cases, high blood pressure during pregnancy can cause: °· Stroke. °· Heart attack. °· Damage to the kidneys, lungs, or liver. °· Preeclampsia. °· Jerky movements you cannot control (convulsions or seizures). °· Problems with the placenta. °How does this affect my baby? °Your baby may: °· Be born early. °· Not weigh as much as he or she should. °· Not handle labor well, leading to a c-section birth. °What are the risks? °· Having high blood pressure during a past  pregnancy. °· Being overweight. °· Being 35 years old or older. °· Being pregnant for the first time. °· Being pregnant with more than one baby. °· Becoming pregnant using fertility methods, such as IVF. °· Having other problems, such as diabetes, or kidney disease. °· Having family members who have high blood pressure. °What can I do to lower my risk? ° °· Keep a healthy weight. °· Eat a healthy diet. °· Follow what your doctor tells you about treating any medical problems that you had before becoming pregnant. °It is very important to go to all of your doctor visits. Your doctor will check your blood pressure and make sure that your pregnancy is progressing as it should. Treatment should start early if a problem is found. °How is this treated? °Treatment for high blood pressure during pregnancy can differ depending on the type of high blood pressure you have and how serious it is. °· You may need to take blood pressure medicine. °· If you have been taking medicine for your blood pressure, you may need to change the medicine during pregnancy if it is not safe for your baby. °· If your doctor thinks that you could get very high blood pressure, he or she may tell you to take a low-dose aspirin during your pregnancy. °· If you have very high blood pressure, you may need to stay in the hospital so you and your baby can be watched closely. You may also need to take medicine to lower your blood pressure. This medicine may be given by mouth   or through an IV tube. °· In some cases, if your condition gets worse, you may need to have your baby early. °Follow these instructions at home: °Eating and drinking ° °· Drink enough fluid to keep your pee (urine) pale yellow. °· Avoid caffeine. °Lifestyle °· Do not use any products that contain nicotine or tobacco, such as cigarettes, e-cigarettes, and chewing tobacco. If you need help quitting, ask your doctor. °· Do not use alcohol or drugs. °· Avoid stress. °· Rest and get plenty  of sleep. °· Regular exercise can help. Ask your doctor what kinds of exercise are best for you. °General instructions °· Take over-the-counter and prescription medicines only as told by your doctor. °· Keep all prenatal and follow-up visits as told by your doctor. This is important. °Contact a doctor if: °· You have symptoms that your doctor told you to watch for, such as: °? Headaches. °? Nausea. °? Vomiting. °? Belly (abdominal) pain. °? Dizziness. °? Light-headedness. °Get help right away if: °· You have: °? Very bad belly pain that does not get better with treatment. °? A very bad headache that does not get better. °? Vomiting that does not get better. °? Sudden, fast weight gain. °? Sudden swelling in your hands, ankles, or face. °? Bleeding from your vagina. °? Blood in your pee. °? Blurry vision. °? Double vision. °? Shortness of breath. °? Chest pain. °? Weakness on one side of your body. °? Trouble talking. °· Your baby is not moving as much as usual. °Summary °· High blood pressure is also called hypertension. °· High blood pressure means that the force of your blood moving in your body is too strong. °· High blood pressure can cause problems for you and your baby. °· Keep all follow-up visits as told by your doctor. This is important. °This information is not intended to replace advice given to you by your health care provider. Make sure you discuss any questions you have with your health care provider. °Document Released: 10/10/2010 Document Revised: 12/29/2018 Document Reviewed: 10/04/2018 °Elsevier Patient Education © 2020 [employer] Inc. ° °

## 2019-05-08 NOTE — Progress Notes (Signed)
   PRENATAL VISIT NOTE  Subjective:  Stacey Dunn is a 38 y.o. G2P1001 at [redacted]w[redacted]d being seen today for ongoing prenatal care.  She is currently monitored for the following issues for this high-risk pregnancy and has AMA (advanced maternal age) multigravida 35+; Obesity, Class I, BMI 30-34.9; History of bipolar disorder; Borderline personality disorder ([place]); Cannabis abuse; Cigarette smoker; Previous cesarean delivery affecting pregnancy, antepartum; History of pregnancy induced hypertension; Supervision of high risk pregnancy in third trimester; Rh negative, antepartum; and Obesity affecting pregnancy, antepartum on their problem list.  Patient reports no complaints.  Contractions: Not present. Vag. Bleeding: None.  Movement: Present. Denies leaking of fluid/ROM.   The following portions of the patient's history were reviewed and updated as appropriate: allergies, current medications, past family history, past medical history, past social history, past surgical history and problem list. Problem list updated.  Objective:   Vitals:   05/08/19 1311  BP: 122/79  Temp: (!) 97.4 F (36.3 C)  Weight: 230 lb (104.3 kg)    Fetal Status: Fetal Heart Rate (bpm): 148 Fundal Height: 42 cm Movement: Present  Presentation: Vertex  General:  Alert, oriented and cooperative. Patient is in no acute distress.  Skin: Skin is warm and dry. No rash noted.   Cardiovascular: Normal heart rate noted  Respiratory: Normal respiratory effort, no problems with respiration noted  Abdomen: Soft, gravid, appropriate for gestational age.  Pain/Pressure: Absent     Pelvic: Cervical exam deferred        Extremities: Normal range of motion.  Edema: Trace  Mental Status: Normal mood and affect. Normal behavior. Normal judgment and thought content.   Assessment and Plan:  Pregnancy: G2P1001 at [redacted]w[redacted]d  1. Obesity affecting pregnancy, antepartum 56 lb (25.4 kg) thus far. U/s 05/04/19 for growth; EFW=64.4%  2. Rh  negative, antepartum Received Rhogam  3. History of bipolar disorder States sl anxious but denies need for counseling appt  4. Cannabis abuse States last MJ "couple months ago"  5. Cigarette smoker Decreasing and smoking 1-2 cpd--pt praised  6. Previous cesarean delivery affecting pregnancy, antepartum Delivery plans appt at [facility]/24/19.  Repeat c/s scheduled 05/23/2019  7. History of pregnancy induced hypertension BP wnl today.  Sl edema--suggestions given Taking ASA daily; counseled may stop now due to 37 wks  8. Supervision of high risk pregnancy in third trimester Feeling tired. Has car seat.  Wants ocp's until husband gets vasectomy.    Term labor symptoms and general obstetric precautions including but not limited to vaginal bleeding, contractions, leaking of fluid and fetal movement were reviewed in detail with the patient. Please refer to After Visit Summary for other counseling recommendations.  Return in about 1 week (around 05/15/2019) for routine [facility], telehealth.  Future Appointments  Date Time Provider [place]  05/15/2019 11:10 AM [name], MD [facility] None  05/15/2019  3:20 PM [facility] PROVIDER [facility] None  05/17/2019  8:00 AM [facility] None    Herbie Saxon, CNM

## 2019-05-15 ENCOUNTER — Other Ambulatory Visit: Payer: Self-pay

## 2019-05-15 ENCOUNTER — Encounter: Payer: Self-pay | Admitting: Advanced Practice Midwife

## 2019-05-15 ENCOUNTER — Encounter: Payer: Self-pay | Admitting: Obstetrics and Gynecology

## 2019-05-15 ENCOUNTER — Ambulatory Visit (INDEPENDENT_AMBULATORY_CARE_PROVIDER_SITE_OTHER): Payer: Medicaid Other | Admitting: Obstetrics and Gynecology

## 2019-05-15 ENCOUNTER — Ambulatory Visit: Payer: Medicaid Other | Admitting: Advanced Practice Midwife

## 2019-05-15 VITALS — BP 120/78 | Ht 64.0 in | Wt 230.0 lb

## 2019-05-15 DIAGNOSIS — Z3A38 38 weeks gestation of pregnancy: Secondary | ICD-10-CM | POA: Diagnosis not present

## 2019-05-15 DIAGNOSIS — O0993 Supervision of high risk pregnancy, unspecified, third trimester: Secondary | ICD-10-CM

## 2019-05-15 DIAGNOSIS — O09293 Supervision of pregnancy with other poor reproductive or obstetric history, third trimester: Secondary | ICD-10-CM | POA: Diagnosis not present

## 2019-05-15 DIAGNOSIS — F603 Borderline personality disorder: Secondary | ICD-10-CM

## 2019-05-15 DIAGNOSIS — O34219 Maternal care for unspecified type scar from previous cesarean delivery: Secondary | ICD-10-CM

## 2019-05-15 DIAGNOSIS — E669 Obesity, unspecified: Secondary | ICD-10-CM

## 2019-05-15 DIAGNOSIS — O99210 Obesity complicating pregnancy, unspecified trimester: Secondary | ICD-10-CM

## 2019-05-15 DIAGNOSIS — F1210 Cannabis abuse, uncomplicated: Secondary | ICD-10-CM

## 2019-05-15 DIAGNOSIS — Z8659 Personal history of other mental and behavioral disorders: Secondary | ICD-10-CM

## 2019-05-15 DIAGNOSIS — Z6791 Unspecified blood type, Rh negative: Secondary | ICD-10-CM

## 2019-05-15 DIAGNOSIS — O09523 Supervision of elderly multigravida, third trimester: Secondary | ICD-10-CM

## 2019-05-15 DIAGNOSIS — F17210 Nicotine dependence, cigarettes, uncomplicated: Secondary | ICD-10-CM

## 2019-05-15 DIAGNOSIS — Z8759 Personal history of other complications of pregnancy, childbirth and the puerperium: Secondary | ICD-10-CM

## 2019-05-15 DIAGNOSIS — O26899 Other specified pregnancy related conditions, unspecified trimester: Secondary | ICD-10-CM

## 2019-05-15 NOTE — Progress Notes (Signed)
   TELEPHONE OBSTETRICS VISIT ENCOUNTER NOTE  I connected with Stacey Dunn@ on 05/15/19 at  3:20 PM EDT by telephone at home and verified that I am speaking with the correct person using two identifiers.   I discussed the limitations, risks, security and privacy concerns of performing an evaluation and management service by telephone and the availability of in person appointments. I also discussed with the patient that there may be a patient responsible charge related to this service. The patient expressed understanding and agreed to proceed.  Subjective:  Stacey Dunn is a 38 y.o. G2P1001 at [redacted]w[redacted]d being followed for ongoing prenatal care.  She is currently monitored for the following issues for this high-risk pregnancy and has AMA (advanced maternal age) multigravida 35+; Obesity, Class I, BMI 30-34.9; History of bipolar disorder; Borderline personality disorder ([place]); Cannabis abuse; Cigarette smoker; Previous cesarean delivery affecting pregnancy, antepartum; History of pregnancy induced hypertension; Supervision of high risk pregnancy in third trimester; Rh negative, antepartum; and Obesity affecting pregnancy, antepartum on their problem list.  Patient reports face, feet, hand edema. Reports fetal movement. Denies any contractions, bleeding or leaking of fluid.   The following portions of the patient's history were reviewed and updated as appropriate: allergies, current medications, past family history, past medical history, past social history, past surgical history and problem list.   Objective:   General:  Alert, oriented and cooperative.   Mental Status: Normal mood and affect perceived. Normal judgment and thought content.  Rest of physical exam deferred due to type of encounter  Assessment and Plan:  Pregnancy: G2P1001 at [redacted]w[redacted]d 1. Obesity affecting pregnancy, antepartum 56 lb 9.6 oz (25.7 kg) 2.  Cannabis abuse Pt states last MJ 2-3 mo ago  3. Cigarette smoker Pt states  not smoking daily--2x/wk.  Counseled via 5 A's to stop smoking  4. Previous cesarean delivery affecting pregnancy, antepartum Delivery plans appt with [facility] today and has repeat c/s scheduled 05/23/19  5. History of pregnancy induced hypertension Stopped taking ASA 81 mg daily.  C/o hands/feet/face edema continuing with onset 36 wks and no change.  Saw [facility] for delivery plans appt and BP= "127 over something".  RTC 05/18/19 to f/u.  Suggestions given to decrease edema. Pt has BP cuff at home.    6.  Supervision of high risk pregnancy in third trimester Feels well.  Knows when to go to L&D  7. Multigravida of advanced maternal age in third trimester Questions answsered  Term labor symptoms and general obstetric precautions including but not limited to vaginal bleeding, contractions, leaking of fluid and fetal movement were reviewed in detail with the patient.  I discussed the assessment and treatment plan with the patient. The patient was provided an opportunity to ask questions and all were answered. The patient agreed with the plan and demonstrated an understanding of the instructions. The patient was advised to call back or seek an in-person office evaluation/go to the hospital for any urgent or concerning symptoms.  Please refer to After Visit Summary for other counseling recommendations.   I provided 11 minutes of non-face-to-face time during this encounter.  Return in about 3 days (around 05/18/2019) for needs clinic visit due to hx PIH and c/o edema of face, hands, feet.  Future Appointments  Date Time Provider [place]  05/17/2019  8:00 AM [facility] None    Herbie Saxon, CNM

## 2019-05-15 NOTE — Progress Notes (Signed)
Patient ID: Stacey PyleAmber N Archila, female   DOB: [birth year], 38 y.o.   MRN: [id number]  Reason for Consult: Pre-op Exam (Slight contractions )   Referred by Martie RoundSpencer, Nicole, NP  Subjective:     HPI:  Stacey Pylember N Aston is a 38 y.o. female. She is feeling well and reports normal fetal movement. Denies contractions. Denies leakage of fluid. Denies vaginal bleeding. Denies headache. Denies vision changes. Denies RUQ pain.  Her pregnancy has been complicated by polyhydramnios, obesity, bipolar disorder, AMA, and hx of preeclampsia. She [facility].   Pregnancy Problems (from 11/21/18 to present)    Problem Noted Resolved   Obesity affecting pregnancy, antepartum 04/17/2019 by Federico FlakeNewton, Kimberly Niles, MD No   Rh negative, antepartum 04/02/2019 by Rubin PayorWarren, Lynette, RN No   Overview Signed 04/14/2019  2:21 PM by Tracey HarriesMayer, Fonda, RN    Rhophylac given 03/08/2019.      Obesity, Class I, BMI 30-34.9 03/15/2019 by Sharlette DenseStephenson, Cathy, RN No   Overview Signed 03/15/2019  4:22 PM by Sharlette DenseStephenson, Cathy, RN    ASA 81 mg 12-36 wks       History of bipolar disorder 03/15/2019 by Sharlette DenseStephenson, Cathy, RN No   Borderline personality disorder (HCC) 03/15/2019 by Sharlette DenseStephenson, Cathy, RN No   Cannabis abuse 03/15/2019 by Sharlette DenseStephenson, Cathy, RN No   Cigarette smoker 03/15/2019 by Sharlette DenseStephenson, Cathy, RN No   Previous cesarean delivery affecting pregnancy, antepartum 03/15/2019 by Sharlette DenseStephenson, Cathy, RN No   Overview Signed 03/15/2019  4:29 PM by Sharlette DenseStephenson, Cathy, RN    Failed IOL, FTP LTCS--desires repeat C/S      History of pregnancy induced hypertension 03/15/2019 by Sharlette DenseStephenson, Cathy, RN No   Overview Signed 05/08/2019  1:21 PM by Alberteen SpindleSciora, Elizabeth A, CNM    ASA until 36 wk [x]  baseline labs wnl      Supervision of high risk pregnancy in third trimester 03/15/2019 by Sharlette DenseStephenson, Cathy, RN No   Overview Addendum 05/08/2019  1:24 PM by Alberteen SpindleSciora, Elizabeth A, CNM     Nursing Staff Provider   Office Location  [facility] Dating  LMP/13  Language  English Anatomy US   WNL, Girl  Flu Vaccine  Needs 2020 shot when avail Genetic Screen  NIPS:   AFP:   First Screen:  Quad:    TDaP vaccine    Hgb A1C or  GTT Third trimester = wnl  Rhogam  Rhogam 03/08/19   LAB RESULTS   Feeding Plan  Breast Blood Type O/Negative/O negative (01/28 0000)   Contraception OCP, partner vasectomy  Antibody  negative  Circumcision n/a Rubella Immune (01/28 0000)  Pediatrician  [place] RPR Nonreactive (06/17 0000)   Support Person FOB - [place] HBsAg Negative (01/28 0000)   Prenatal Classes  HIV Non-reactive (06/17 0000)    Varicella  immune  BTL Consent  GBS  (For PCN allergy, check sensitivities)        VBAC Consent  Pap      Hgb Electro    BP Cuff ordered  yes- has BP cuff CF   Delivery Group  [facility]   [facility] Group  NA           AMA (advanced maternal age) multigravida 35+  by Katrina StackWells, Deborah No       Past Medical History:  Diagnosis Date  . 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 Bipolar 2 disorder (HCC)  no meds currently  . Caries   . Constipation   . Depression    History -no meds  . History of borderline personality disorder    no meds currently  . History of chest pain    Per client, eval at [facility] ED 2017 - 2018. "Nodules right lung."  No follow-up by client.  . Hypertension    History "PIH"  . Migraine    History  . Nausea & vomiting    associated with pregnancy  . Varicosities of leg   . Wears partial dentures    Upper   Family History  Problem Relation Age of Onset  . Kidney disease Mother   . Renal Disease Mother   . Hypertension Mother   . Alcohol abuse Father   . Hypertension Sister   . Cancer Sister   . Heart Problems Sister   . Cancer Maternal Uncle   . Cancer Maternal Grandfather   . Cancer Paternal Grandfather   . Diabetes Paternal Grandfather   . Epilepsy Sister   . Migraines Sister     Past Surgical History:  Procedure Laterality Date  . ANKLE SURGERY Right    For Staph infection  . CESAREAN SECTION      Short Social History:  Social History   Tobacco Use  . Smoking status: Current Every Day Smoker    Packs/day: 0.25    Years: 21.00    Pack years: 5.25    Types: Cigarettes  . Smokeless tobacco: Never Used  Substance Use Topics  . Alcohol use: Not Currently    Allergies  Allergen Reactions  . Meloxicam Swelling  . Morphine And Related   . Penicillins     Hives Did it involve swelling of the face/tongue/throat, SOB, or low BP? No Did it involve sudden or severe rash/hives, skin peeling, or any reaction on the inside of your mouth or nose? Yes Did you need to seek medical attention at a hospital or doctor's office? Yes When did it last happen?childhood  If all above answers are "NO", may proceed with cephalosporin use.   Marland Kitchen Morphine Hives, Other (See Comments) and Rash    hallucinations   . Sulfa Antibiotics Hives, Other (See Comments) and Rash    unknown     Current Outpatient Medications  Medication Sig Dispense Refill  . Prenatal Vit-Fe Fumarate-FA (PRENATAL MULTIVITAMIN) TABS tablet Take 1 tablet by mouth daily.     Marland Kitchen aspirin EC 81 MG tablet Take 81 mg by mouth daily.     No current facility-administered medications for this visit.     Review of Systems  Constitutional: Negative for chills, fatigue, fever and unexpected weight change.  HENT: Negative for trouble swallowing.  Eyes: Negative for loss of vision.  Respiratory: Negative for cough, shortness of breath and wheezing.  Cardiovascular: Negative for chest pain, leg swelling, palpitations and syncope.  GI: Negative for abdominal pain, blood in stool, diarrhea, nausea and vomiting.  GU: Negative for difficulty urinating, dysuria, frequency and hematuria.  Musculoskeletal: Negative for back pain, leg pain and joint pain.  Skin: Negative for rash.  Neurological: Negative for  dizziness, headaches, light-headedness, numbness and seizures.  Psychiatric: Negative for behavioral problem, confusion, depressed mood and sleep disturbance.        Objective:  Objective   Vitals:   05/15/19 1108  BP: 120/78  Weight: 230 lb (104.3 kg)  Height: 5\' 4"  (1.626 m)   Body mass index is 39.48 kg/m.  Physical Exam Vitals signs and  nursing note reviewed.  Constitutional:      Appearance: She is well-developed.  HENT:     Head: Normocephalic and atraumatic.  Eyes:     Pupils: Pupils are equal, round, and reactive to light.  Cardiovascular:     Rate and Rhythm: Normal rate and regular rhythm.  Pulmonary:     Effort: Pulmonary effort is normal. No respiratory distress.  Skin:    General: Skin is warm and dry.  Neurological:     Mental Status: She is alert and oriented to person, place, and time.  Psychiatric:        Behavior: Behavior normal.        Thought Content: Thought content normal.        Judgment: Judgment normal.   FHR: 140bpm     Assessment/Plan:     38 yo with hx or prior LTCS Repeat cesarean section at term planned for next week. Have discussed with patient the risks benefits and alternatives to this surgery in detail Risks including bleeding, infection and damage to surrounding organs and hysterectomy.  Will proceed with repeat LTCS scheduled for 05/23/2019. Preoperative procedures, NPO status and coivd testing discussed.   More than 25 minutes were spent face to face with the patient in the room with more than 50% of the time spent providing counseling and discussing the plan of management.    Adelene Idlerhristanna Omero Kowal MD [facility], [facility] 05/15/2019 11:36 AM

## 2019-05-15 NOTE — Progress Notes (Signed)
Call to client for Telehealth visit; confirmed identity and agrees to visit; BP & Wt entered per client today; had [facility] appt. This am for C-S 05/23/19; c/o fee/hands/face swelling  Debera Lat, RN

## 2019-05-17 ENCOUNTER — Other Ambulatory Visit: Payer: Self-pay

## 2019-05-17 ENCOUNTER — Encounter
Admission: RE | Admit: 2019-05-17 | Discharge: 2019-05-17 | Disposition: A | Discharge status: Home / Self Care | Payer: Medicaid Other | Source: Ambulatory Visit | Attending: Obstetrics and Gynecology | Admitting: Obstetrics and Gynecology

## 2019-05-17 DIAGNOSIS — Z01812 Encounter for preprocedural laboratory examination: Secondary | ICD-10-CM | POA: Diagnosis not present

## 2019-05-17 HISTORY — DX: Chronic obstructive pulmonary disease, unspecified: J44.9

## 2019-05-17 NOTE — Patient Instructions (Addendum)
Your procedure is scheduled on: Tuesday 05/23/19 Report to [facility] call number below the day before to get arrival time. Labor & Delivery [phone].  Remember: Instructions that are not followed completely may result in serious medical risk, up to and including death, or upon the discretion of your surgeon and anesthesiologist your surgery may need to be rescheduled.     _X__ 1. Do not eat food after midnight the night before your procedure.                                                          No gum chewing or hard candies. You may drink clear liquids up to 2 hours              (DRINK ENSURE PRE-SURGERY DRINK BY 6 AM)                 before you are scheduled to arrive for your surgery- DO not drink clear                 liquids within 2 hours of the start of your surgery.                 Clear Liquids include:  water, apple juice without pulp, clear carbohydrate                 drink such as Clearfast or Gatorade, Black Coffee or Tea (Do not add                 anything to coffee or tea). Diabetics water only  __X__2.  On the morning of surgery brush your teeth with toothpaste and water, you                 may rinse your mouth with mouthwash if you wish.  Do not swallow any              toothpaste of mouthwash.     _X__ 3.  No Alcohol for 24 hours before or after surgery.   _X__ 4.  Do Not Smoke or use e-cigarettes For 24 Hours Prior to Your Surgery.                 Do not use any chewable tobacco products for at least 6 hours prior to                 surgery.  ____  5.  Bring all medications with you on the day of surgery if instructed.   __X__  6.  Notify your doctor if there is any change in your medical condition      (cold, fever, infections).     Do not wear jewelry, make-up, hairpins, clips or nail polish. Do not wear lotions, powders, or perfumes.  Do not shave 48 hours prior to surgery. Men may shave face and neck. Do not bring valuables to the hospital.     [facility] is not responsible for any belongings or valuables.  Contacts, dentures/partials or body piercings may not be worn into surgery. Bring a case for your contacts, glasses or hearing aids, a denture cup will be supplied. Leave your suitcase in the car. After surgery it may be brought to your room. For patients admitted to the hospital, discharge time is  determined by your treatment team.   Patients discharged the day of surgery will not be allowed to drive home.   Please read over the following fact sheets that you were given:   MRSA Information  __X__ Take these medicines the morning of surgery with A SIP OF WATER:    1. NONE  2.   3.   4.  5.  6.  ____ Fleet Enema (as directed)   __X__ Use CHG Soap/SAGE wipes as directed avoid using CHG on breasts.  ____ Use inhalers on the day of surgery  ____ Stop metformin/Janumet/Farxiga 2 days prior to surgery    ____ Take 1/2 of usual insulin dose the night before surgery. No insulin the morning          of surgery.   ____ Stop Blood Thinners Coumadin/Plavix/Xarelto/Pleta/Pradaxa/Eliquis/Effient/Aspirin  on   Or contact your Surgeon, Cardiologist or Medical Doctor regarding  ability to stop your blood thinners  __X__ Stop Anti-inflammatories 7 days before surgery such as Advil, Ibuprofen, Motrin,  BC or Goodies Powder, Naprosyn, Naproxen, Aleve, Aspirin    __X__ Stop all herbal supplements, fish oil or vitamin E until after surgery.    ____ Bring C-Pap to the hospital.      TELEPHONE INTERVIEW WITH PATIENT. VERBAL INSTRUCTIONS GIVEN. PATIENT VERBALIZED UNDERSTANDING.

## 2019-05-18 ENCOUNTER — Encounter: Payer: Self-pay | Admitting: Physician Assistant

## 2019-05-18 ENCOUNTER — Ambulatory Visit: Payer: Medicaid Other | Admitting: Advanced Practice Midwife

## 2019-05-18 ENCOUNTER — Encounter: Payer: Self-pay | Admitting: Advanced Practice Midwife

## 2019-05-18 VITALS — BP 123/85 | Temp 97.5°F | Wt 229.6 lb

## 2019-05-18 DIAGNOSIS — O409XX0 Polyhydramnios, unspecified trimester, not applicable or unspecified: Secondary | ICD-10-CM | POA: Insufficient documentation

## 2019-05-18 DIAGNOSIS — Z8759 Personal history of other complications of pregnancy, childbirth and the puerperium: Secondary | ICD-10-CM

## 2019-05-18 DIAGNOSIS — O34219 Maternal care for unspecified type scar from previous cesarean delivery: Secondary | ICD-10-CM

## 2019-05-18 DIAGNOSIS — O0993 Supervision of high risk pregnancy, unspecified, third trimester: Secondary | ICD-10-CM

## 2019-05-18 DIAGNOSIS — F17210 Nicotine dependence, cigarettes, uncomplicated: Secondary | ICD-10-CM

## 2019-05-18 LAB — URINALYSIS
Bilirubin, UA: NEGATIVE
Glucose, UA: NEGATIVE
Ketones, UA: NEGATIVE
Nitrite, UA: NEGATIVE
Protein,UA: NEGATIVE
RBC, UA: NEGATIVE
Specific Gravity, UA: 1.02 (ref 1.005–1.030)
Urobilinogen, Ur: 1 mg/dL (ref 0.2–1.0)
pH, UA: 7 (ref 5.0–7.5)

## 2019-05-18 NOTE — Progress Notes (Signed)
   PRENATAL VISIT NOTE  Subjective:  Stacey Dunn is a 38 y.o. G2P1001 at [redacted]w[redacted]d being seen today for ongoing prenatal care.  She is currently monitored for the following issues for this high-risk pregnancy and has AMA (advanced maternal age) multigravida 35+; Obesity, Class I, BMI 30-34.9; History of bipolar disorder; Borderline personality disorder ([place]); Cannabis abuse; Cigarette smoker; Previous cesarean delivery affecting pregnancy, antepartum; History of pregnancy induced hypertension; Supervision of high risk pregnancy in third trimester; Rh negative, antepartum; and Obesity affecting pregnancy, antepartum on their problem list.  Patient reports no complaints.  Contractions: Irregular. Vag. Bleeding: None.  Movement: Present. Denies leaking of fluid/ROM.   The following portions of the patient's history were reviewed and updated as appropriate: allergies, current medications, past family history, past medical history, past social history, past surgical history and problem list. Problem list updated.  Objective:   Vitals:   05/18/19 1035  BP: 123/85  Temp: (!) 97.5 F (36.4 C)  Weight: 229 lb 9.6 oz (104.1 kg)    Fetal Status: Fetal Heart Rate (bpm): 150 Fundal Height: 42 cm Movement: Present  Presentation: Vertex  General:  Alert, oriented and cooperative. Patient is in no acute distress.  Skin: Skin is warm and dry. No rash noted.   Cardiovascular: Normal heart rate noted  Respiratory: Normal respiratory effort, no problems with respiration noted  Abdomen: Soft, gravid, appropriate for gestational age.  Pain/Pressure: Absent     Pelvic: Cervical exam deferred        Extremities: Normal range of motion.  Edema: Trace  Mental Status: Normal mood and affect. Normal behavior. Normal judgment and thought content.   Assessment and Plan:  Pregnancy: G2P1001 at [redacted]w[redacted]d  1. Cigarette smoker Trying to stop.  Counseled via 5 A's to stop smoking  2. Previous cesarean delivery  affecting pregnancy, antepartum C/s scheduled 05/23/19.  Knows when to go to L&D  3. History of pregnancy induced hypertension 55 lb 9.6 oz (25.2 kg).  BP=wnl today.  Urine dip today. Edema better with grapefruit juice, increased water, and rest  4. Supervision of high risk pregnancy in third trimester C/o miseries of pregnancy.  Anxious about getting epidural placed and possible panic attack--to talk to anesthesiologist; suggestions given for relaxation - Urinalysis (Urine Dip)=neg protein   Term labor symptoms and general obstetric precautions including but not limited to vaginal bleeding, contractions, leaking of fluid and fetal movement were reviewed in detail with the patient. Please refer to After Visit Summary for other counseling recommendations.  Return in about 1 week (around 05/25/2019) for routine PNC.  Future Appointments  Date Time Provider [place]  05/19/2019  9:50 AM [facility] None    Herbie Saxon, CNM

## 2019-05-18 NOTE — Progress Notes (Signed)
Denies s/s or exposure to Covid-19. Taking PNV QD, denies ED/hospital visits since last RV. Scheduled C-Section 05/23/2019 and Covid pre testing tomorrow. Hal Morales, RN

## 2019-05-19 ENCOUNTER — Encounter: Payer: Medicaid Other | Admitting: Obstetrics and Gynecology

## 2019-05-19 ENCOUNTER — Other Ambulatory Visit
Admission: RE | Admit: 2019-05-19 | Discharge: 2019-05-19 | Disposition: A | Discharge status: Home / Self Care | Payer: Medicaid Other | Source: Ambulatory Visit | Attending: Obstetrics and Gynecology | Admitting: Obstetrics and Gynecology

## 2019-05-19 ENCOUNTER — Encounter
Admission: EM | Disposition: A | Discharge status: Home / Self Care | Payer: Self-pay | Source: Home / Self Care | Attending: Obstetrics and Gynecology

## 2019-05-19 ENCOUNTER — Inpatient Hospital Stay
Admission: EM | Admit: 2019-05-19 | Discharge: 2019-05-23 | DRG: 787 | Disposition: A | Discharge status: Home / Self Care | Payer: Medicaid Other | Attending: Obstetrics and Gynecology | Admitting: Obstetrics and Gynecology

## 2019-05-19 ENCOUNTER — Inpatient Hospital Stay: Payer: Medicaid Other | Admitting: Certified Registered"

## 2019-05-19 ENCOUNTER — Other Ambulatory Visit: Payer: Self-pay

## 2019-05-19 ENCOUNTER — Encounter: Payer: Self-pay | Admitting: Certified Registered"

## 2019-05-19 DIAGNOSIS — F603 Borderline personality disorder: Secondary | ICD-10-CM

## 2019-05-19 DIAGNOSIS — O34219 Maternal care for unspecified type scar from previous cesarean delivery: Secondary | ICD-10-CM | POA: Diagnosis present

## 2019-05-19 DIAGNOSIS — O0993 Supervision of high risk pregnancy, unspecified, third trimester: Secondary | ICD-10-CM

## 2019-05-19 DIAGNOSIS — O99210 Obesity complicating pregnancy, unspecified trimester: Secondary | ICD-10-CM

## 2019-05-19 DIAGNOSIS — O34211 Maternal care for low transverse scar from previous cesarean delivery: Secondary | ICD-10-CM

## 2019-05-19 DIAGNOSIS — Z20828 Contact with and (suspected) exposure to other viral communicable diseases: Secondary | ICD-10-CM | POA: Insufficient documentation

## 2019-05-19 DIAGNOSIS — F17210 Nicotine dependence, cigarettes, uncomplicated: Secondary | ICD-10-CM | POA: Diagnosis present

## 2019-05-19 DIAGNOSIS — Z880 Allergy status to penicillin: Secondary | ICD-10-CM

## 2019-05-19 DIAGNOSIS — Z886 Allergy status to analgesic agent status: Secondary | ICD-10-CM | POA: Diagnosis not present

## 2019-05-19 DIAGNOSIS — O09523 Supervision of elderly multigravida, third trimester: Secondary | ICD-10-CM

## 2019-05-19 DIAGNOSIS — B9562 Methicillin resistant Staphylococcus aureus infection as the cause of diseases classified elsewhere: Secondary | ICD-10-CM | POA: Diagnosis not present

## 2019-05-19 DIAGNOSIS — O9081 Anemia of the puerperium: Secondary | ICD-10-CM | POA: Diagnosis not present

## 2019-05-19 DIAGNOSIS — Z8614 Personal history of Methicillin resistant Staphylococcus aureus infection: Secondary | ICD-10-CM

## 2019-05-19 DIAGNOSIS — L089 Local infection of the skin and subcutaneous tissue, unspecified: Secondary | ICD-10-CM | POA: Diagnosis not present

## 2019-05-19 DIAGNOSIS — L739 Follicular disorder, unspecified: Secondary | ICD-10-CM | POA: Diagnosis present

## 2019-05-19 DIAGNOSIS — Z888 Allergy status to other drugs, medicaments and biological substances status: Secondary | ICD-10-CM | POA: Diagnosis not present

## 2019-05-19 DIAGNOSIS — Z01812 Encounter for preprocedural laboratory examination: Secondary | ICD-10-CM | POA: Insufficient documentation

## 2019-05-19 DIAGNOSIS — O26893 Other specified pregnancy related conditions, third trimester: Secondary | ICD-10-CM | POA: Diagnosis present

## 2019-05-19 DIAGNOSIS — Z3A39 39 weeks gestation of pregnancy: Secondary | ICD-10-CM

## 2019-05-19 DIAGNOSIS — Z8659 Personal history of other mental and behavioral disorders: Secondary | ICD-10-CM

## 2019-05-19 DIAGNOSIS — O409XX0 Polyhydramnios, unspecified trimester, not applicable or unspecified: Secondary | ICD-10-CM

## 2019-05-19 DIAGNOSIS — L03211 Cellulitis of face: Secondary | ICD-10-CM | POA: Diagnosis present

## 2019-05-19 DIAGNOSIS — E66811 Obesity, class 1: Secondary | ICD-10-CM | POA: Diagnosis present

## 2019-05-19 DIAGNOSIS — O09529 Supervision of elderly multigravida, unspecified trimester: Secondary | ICD-10-CM

## 2019-05-19 DIAGNOSIS — O26899 Other specified pregnancy related conditions, unspecified trimester: Secondary | ICD-10-CM

## 2019-05-19 DIAGNOSIS — Z8619 Personal history of other infectious and parasitic diseases: Secondary | ICD-10-CM | POA: Diagnosis not present

## 2019-05-19 DIAGNOSIS — Z8759 Personal history of other complications of pregnancy, childbirth and the puerperium: Secondary | ICD-10-CM

## 2019-05-19 DIAGNOSIS — Z881 Allergy status to other antibiotic agents status: Secondary | ICD-10-CM | POA: Diagnosis not present

## 2019-05-19 DIAGNOSIS — O9972 Diseases of the skin and subcutaneous tissue complicating childbirth: Secondary | ICD-10-CM | POA: Diagnosis present

## 2019-05-19 DIAGNOSIS — E669 Obesity, unspecified: Secondary | ICD-10-CM | POA: Diagnosis present

## 2019-05-19 DIAGNOSIS — O0990 Supervision of high risk pregnancy, unspecified, unspecified trimester: Secondary | ICD-10-CM

## 2019-05-19 DIAGNOSIS — Z885 Allergy status to narcotic agent status: Secondary | ICD-10-CM

## 2019-05-19 DIAGNOSIS — D62 Acute posthemorrhagic anemia: Secondary | ICD-10-CM | POA: Diagnosis not present

## 2019-05-19 DIAGNOSIS — O99334 Smoking (tobacco) complicating childbirth: Secondary | ICD-10-CM | POA: Diagnosis present

## 2019-05-19 DIAGNOSIS — F1210 Cannabis abuse, uncomplicated: Secondary | ICD-10-CM

## 2019-05-19 DIAGNOSIS — Z6791 Unspecified blood type, Rh negative: Secondary | ICD-10-CM

## 2019-05-19 LAB — CBC
HCT: 46.6 % — ABNORMAL HIGH (ref 36.0–46.0)
Hemoglobin: 15.8 g/dL — ABNORMAL HIGH (ref 12.0–15.0)
MCH: 28.8 pg (ref 26.0–34.0)
MCHC: 33.9 g/dL (ref 30.0–36.0)
MCV: 85 fL (ref 80.0–100.0)
Platelets: 169 10*3/uL (ref 150–400)
RBC: 5.48 MIL/uL — ABNORMAL HIGH (ref 3.87–5.11)
RDW: 13.7 % (ref 11.5–15.5)
WBC: 15.1 10*3/uL — ABNORMAL HIGH (ref 4.0–10.5)
nRBC: 0 % (ref 0.0–0.2)

## 2019-05-19 LAB — COMPREHENSIVE METABOLIC PANEL
ALT: 22 U/L (ref 0–44)
AST: 30 U/L (ref 15–41)
Albumin: 2.9 g/dL — ABNORMAL LOW (ref 3.5–5.0)
Alkaline Phosphatase: 138 U/L — ABNORMAL HIGH (ref 38–126)
Anion gap: 12 (ref 5–15)
BUN: 6 mg/dL (ref 6–20)
CO2: 20 mmol/L — ABNORMAL LOW (ref 22–32)
Calcium: 8.7 mg/dL — ABNORMAL LOW (ref 8.9–10.3)
Chloride: 103 mmol/L (ref 98–111)
Creatinine, Ser: 0.61 mg/dL (ref 0.44–1.00)
GFR calc Af Amer: >60 mL/min (ref >60)
GFR calc non Af Amer: >60 mL/min (ref >60)
Glucose, Bld: 85 mg/dL (ref 70–99)
Potassium: 3.4 mmol/L — ABNORMAL LOW (ref 3.5–5.1)
Sodium: 135 mmol/L (ref 135–145)
Total Bilirubin: 0.8 mg/dL (ref 0.3–1.2)
Total Protein: 7 g/dL (ref 6.5–8.1)

## 2019-05-19 LAB — PROTEIN / CREATININE RATIO, URINE
Creatinine, Urine: 80 mg/dL
Protein Creatinine Ratio: 0.28 mg/mg{Cre} — ABNORMAL HIGH (ref 0.00–0.15)
Total Protein, Urine: 22 mg/dL

## 2019-05-19 LAB — SARS CORONAVIRUS 2 (TAT 6-24 HRS): SARS Coronavirus 2: NEGATIVE

## 2019-05-19 SURGERY — Surgical Case
Anesthesia: Spinal | Site: Abdomen

## 2019-05-19 MED ORDER — ONDANSETRON HCL 4 MG/2ML IJ SOLN
INTRAMUSCULAR | Status: DC | PRN
  Administered 2019-05-19: 4 mg via INTRAVENOUS

## 2019-05-19 MED ORDER — BUPIVACAINE 0.25 % ON-Q PUMP DUAL CATH 400 ML
400.0000 mL | INJECTION | Status: DC
  Filled 2019-05-19: qty 400

## 2019-05-19 MED ORDER — CLINDAMYCIN PHOSPHATE 900 MG/50ML IV SOLN
900.0000 mg | INTRAVENOUS | Status: AC
  Administered 2019-05-19: 900 mg via INTRAVENOUS
  Filled 2019-05-19: qty 50

## 2019-05-19 MED ORDER — SODIUM CHLORIDE 0.9 % IV SOLN
INTRAVENOUS | Status: DC | PRN
  Administered 2019-05-19: 23:00:00 30 ug/min via INTRAVENOUS

## 2019-05-19 MED ORDER — GENTAMICIN SULFATE 40 MG/ML IJ SOLN
5.0000 mg/kg | INTRAVENOUS | Status: AC
  Administered 2019-05-19: 23:00:00 520 mg via INTRAVENOUS
  Filled 2019-05-19: qty 13

## 2019-05-19 MED ORDER — BUPIVACAINE IN DEXTROSE 0.75-8.25 % IT SOLN
INTRATHECAL | Status: DC | PRN
  Administered 2019-05-19: 1.6 mL via INTRATHECAL

## 2019-05-19 MED ORDER — VANCOMYCIN HCL 10 G IV SOLR
2000.0000 mg | Freq: Once | INTRAVENOUS | Status: AC
  Administered 2019-05-19: 2000 mg via INTRAVENOUS
  Filled 2019-05-19: qty 2000

## 2019-05-19 MED ORDER — TETRACAINE HCL 1 % IJ SOLN
INTRAMUSCULAR | Status: DC | PRN
  Administered 2019-05-19: 1 mg via INTRASPINAL

## 2019-05-19 MED ORDER — DEXTROSE IN LACTATED RINGERS 5 % IV SOLN
INTRAVENOUS | Status: DC
  Administered 2019-05-19: 21:00:00 via INTRAVENOUS

## 2019-05-19 MED ORDER — ALPRAZOLAM 0.5 MG PO TABS
0.5000 mg | ORAL_TABLET | Freq: Two times a day (BID) | ORAL | Status: DC | PRN
  Administered 2019-05-19: 19:00:00 0.5 mg via ORAL
  Filled 2019-05-19: qty 1

## 2019-05-19 MED ORDER — BUPIVACAINE HCL (PF) 0.5 % IJ SOLN
INTRAMUSCULAR | Status: AC
  Filled 2019-05-19: qty 30

## 2019-05-19 MED ORDER — LACTATED RINGERS IV SOLN: INTRAVENOUS | Status: DC

## 2019-05-19 MED ORDER — PHENYLEPHRINE HCL (PRESSORS) 10 MG/ML IV SOLN
INTRAVENOUS | Status: DC | PRN
  Administered 2019-05-19: 100 ug via INTRAVENOUS

## 2019-05-19 MED ORDER — VANCOMYCIN HCL IN DEXTROSE 1-5 GM/200ML-% IV SOLN
1000.0000 mg | Freq: Once | INTRAVENOUS | Status: DC
  Filled 2019-05-19: qty 200

## 2019-05-19 MED ORDER — LACTATED RINGERS IV SOLN
INTRAVENOUS | Status: DC
  Administered 2019-05-19: 22:00:00 via INTRAVENOUS

## 2019-05-19 MED ORDER — OXYTOCIN 40 UNITS IN NORMAL SALINE INFUSION - SIMPLE MED
INTRAVENOUS | Status: DC | PRN
  Administered 2019-05-19: 1000 mL via INTRAVENOUS

## 2019-05-19 MED ORDER — VANCOMYCIN HCL IN DEXTROSE 1-5 GM/200ML-% IV SOLN
1000.0000 mg | Freq: Two times a day (BID) | INTRAVENOUS | Status: DC
  Filled 2019-05-19: qty 200

## 2019-05-19 MED ORDER — OXYTOCIN 40 UNITS IN NORMAL SALINE INFUSION - SIMPLE MED
INTRAVENOUS | Status: AC
  Filled 2019-05-19: qty 1000

## 2019-05-19 MED ORDER — SOD CITRATE-CITRIC ACID 500-334 MG/5ML PO SOLN
30.0000 mL | ORAL | Status: AC
  Administered 2019-05-19: 30 mL via ORAL
  Filled 2019-05-19: qty 30

## 2019-05-19 MED ORDER — LACTATED RINGERS IV SOLN: Freq: Once | INTRAVENOUS | Status: DC

## 2019-05-19 MED ORDER — LACTATED RINGERS IV BOLUS
500.0000 mL | Freq: Once | INTRAVENOUS | Status: AC
  Administered 2019-05-19: 20:00:00 500 mL via INTRAVENOUS

## 2019-05-19 MED ORDER — PROPOFOL 10 MG/ML IV BOLUS
INTRAVENOUS | Status: AC
  Filled 2019-05-19: qty 20

## 2019-05-19 MED ORDER — BUPIVACAINE HCL 0.5 % IJ SOLN
INTRAMUSCULAR | Status: DC | PRN
  Administered 2019-05-19: 10 mL

## 2019-05-19 SURGICAL SUPPLY — 33 items
CANISTER SUCT 3000ML PPV (MISCELLANEOUS) ×3 IMPLANT
CHLORAPREP W/TINT 26 (MISCELLANEOUS) ×6 IMPLANT
DERMABOND ADVANCED (GAUZE/BANDAGES/DRESSINGS) ×2
DERMABOND ADVANCED .7 DNX12 (GAUZE/BANDAGES/DRESSINGS) ×1 IMPLANT
DRSG OPSITE POSTOP 4X10 (GAUZE/BANDAGES/DRESSINGS) ×3 IMPLANT
ELECT CAUTERY BLADE 6.4 (BLADE) ×3 IMPLANT
ELECT REM PT RETURN 9FT ADLT (ELECTROSURGICAL) ×3
ELECTRODE REM PT RTRN 9FT ADLT (ELECTROSURGICAL) ×1 IMPLANT
EXTRACTOR VACUUM KIWI (MISCELLANEOUS) ×2 IMPLANT
GLOVE BIO SURGEON STRL SZ7.5 (GLOVE) ×2 IMPLANT
GLOVE BIO SURGEON STRL SZ8 (GLOVE) ×2 IMPLANT
GLOVE BIOGEL PI IND STRL 6.5 (GLOVE) ×2 IMPLANT
GLOVE BIOGEL PI INDICATOR 6.5 (GLOVE) ×4
GLOVE SKINSENSE NS SZ7.0 (GLOVE) ×2
GLOVE SKINSENSE NS SZ8.0 LF (GLOVE) ×2
GLOVE SKINSENSE STRL SZ7.0 (GLOVE) IMPLANT
GLOVE SKINSENSE STRL SZ8.0 LF (GLOVE) IMPLANT
GOWN STRL REUS W/ TWL LRG LVL3 (GOWN DISPOSABLE) ×1 IMPLANT
GOWN STRL REUS W/ TWL XL LVL3 (GOWN DISPOSABLE) ×2 IMPLANT
GOWN STRL REUS W/TWL LRG LVL3 (GOWN DISPOSABLE) ×2
GOWN STRL REUS W/TWL XL LVL3 (GOWN DISPOSABLE) ×4
NS IRRIG 1000ML POUR BTL (IV SOLUTION) ×3 IMPLANT
PACK C SECTION AR (MISCELLANEOUS) ×3 IMPLANT
PAD OB MATERNITY 4.3X12.25 (PERSONAL CARE ITEMS) ×5 IMPLANT
PAD PREP 24X41 OB/GYN DISP (PERSONAL CARE ITEMS) ×3 IMPLANT
PENCIL SMOKE ULTRAEVAC 22 CON (MISCELLANEOUS) ×3 IMPLANT
RTRCTR C-SECT PINK 25CM LRG (MISCELLANEOUS) ×2 IMPLANT
SUT MNCRL AB 4-0 PS2 18 (SUTURE) ×3 IMPLANT
SUT PLAIN 3-0 (SUTURE) ×3 IMPLANT
SUT VIC AB 0 CT1 36 (SUTURE) ×11 IMPLANT
SUT VIC AB 2-0 CT1 36 (SUTURE) ×3 IMPLANT
SUT VICRYL 3-0 (SUTURE) ×2 IMPLANT
SYR 30ML LL (SYRINGE) ×6 IMPLANT

## 2019-05-19 NOTE — Anesthesia Post-op Follow-up Note (Signed)
Anesthesia QCDR form completed.        

## 2019-05-19 NOTE — Anesthesia Preprocedure Evaluation (Addendum)
Anesthesia Evaluation  Patient identified by MRN, date of birth, ID band Patient awake    Reviewed: Allergy & Precautions, NPO status , Patient's Chart, lab work & pertinent test results  Airway Mallampati: III       Dental   Pulmonary COPD, Current Smoker,    Pulmonary exam normal        Cardiovascular hypertension, Normal cardiovascular exam     Neuro/Psych  Headaches, PSYCHIATRIC DISORDERS Depression Bipolar Disorder    GI/Hepatic negative GI ROS,   Endo/Other    Renal/GU      Musculoskeletal  (+) Arthritis , Osteoarthritis,  Back pain   Abdominal Normal abdominal exam  (+)   Peds negative pediatric ROS (+)  Hematology   Anesthesia Other Findings Past Medical History: No date: Back pain     Comment:  Per client, bat wing deformity of lower back with hx of               cortisone injection No date: Bipolar 2 disorder (HCC)     Comment:  no meds currently No date: Caries No date: Constipation No date: COPD (chronic obstructive pulmonary disease) (HCC) No date: Depression     Comment:  History -no meds No date: History of borderline personality disorder     Comment:  no meds currently No date: History of chest pain     Comment:  Per client, eval at [facility] ED 2017 - 2018. "Nodules right               lung."  No follow-up by client. No date: Hypertension     Comment:  History "PIH" No date: Migraine     Comment:  History No date: Nausea & vomiting     Comment:  associated with pregnancy No date: Varicosities of leg No date: Wears partial dentures     Comment:  Upper  Reproductive/Obstetrics                            Anesthesia Physical Anesthesia Plan  ASA: II  Anesthesia Plan: Spinal   Post-op Pain Management:    Induction:   PONV Risk Score and Plan:   Airway Management Planned: Nasal Cannula  Additional Equipment:   Intra-op Plan:   Post-operative Plan:    Informed Consent: I have reviewed the patients History and Physical, chart, labs and discussed the procedure including the risks, benefits and alternatives for the proposed anesthesia with the patient or authorized representative who has indicated his/her understanding and acceptance.     Dental advisory given  Plan Discussed with: CRNA and Surgeon  Anesthesia Plan Comments:         Anesthesia Quick Evaluation

## 2019-05-19 NOTE — Op Note (Signed)
Cesarean Section Procedure Note 05/19/19  Pre-operative Diagnosis:  1. Hx of prior cesarean section  2. Early labor  3. [redacted] week gestation Post-operative Diagnosis: same, delivered. Procedure: Repeat Low Transverse Cesarean Section  Surgeon: Adrian Prows MD   Assistant(s): Barnett Applebaum MD - No other skilled surgical assistant available. Anesthesia: Spinal Estimated Blood Loss: 341 cc Complications: None; patient tolerated the procedure well.   Disposition: PACU - hemodynamically stable. Condition: stable   Findings: A female infant in the cephalic presentation. Amniotic fluid - clear   Birth weight: 9 lbs 0oz Apgars of 9 and 9.  Intact placenta with a three-vessel cord. Grossly normal uterus, tubes and ovaries bilaterally. No intraabdominal adhesions were noted.   Procedure Details    The patient was taken to operating room, identified as the correct patient and the procedure verified as C-Section Delivery. A time out was held and the above information confirmed. After induction of anesthesia, the patient was draped and prepped in the usual sterile manner. A Pfannenstiel incision was made and carried down through the subcutaneous tissue to the fascia. Fascial incision was made and extended transversely with the Mayo scissors. The fascia was separated from the underlying rectus tissue superiorly and inferiorly. The peritoneum was identified and entered bluntly. Peritoneal incision was extended longitudinally. A low transverse hysterotomy was made. The fetus was delivered atraumatically with assistance using the kiwi vacuum. Two pulls with 1 pop off. The umbilical cord was clamped x2 and cut and the infant was handed to the awaiting pediatricians. The placenta was removed intact and appeared normal with a 3-vessel cord. An Alexis retractor was placed. The uterus cleared of all clot and debris. The hysterotomy was closed with running sutures of 0 Vicryl suture. A second imbricating layer  was placed with the same suture. Excellent hemostasis was observed.   The uterus was returned to the abdomen. The pelvis was irrigated and again, excellent hemostasis was noted. The Alexis retractor was removed. The muscle extension was repaired with a single mattress suture of 0-vicryl. The On Q Pain pump System was then placed.  Trocars were placed through the abdominal wall into the subfascial space and these were used to thread the silver soaker cathaters into place.The rectus muscles were inspected and were hemostatic. The rectus fascia was then reapproximated with running sutures of 0-vicryl, with careful placement not to incorporate the cathaters. Subcutaneous tissues are then irrigated with saline and hemostasis assured with the bovie. The subcutaneous fat was approximated with 3-0 plain and a running stitch.  The skin was closed with 4-0 monocryl suture in a subcuticular fashion followed by skin adhesive. The cathaters are flushed each with 5 mL of Bupivicaine and stabilized into place with dressing. Instrument, sponge, and needle counts were correct prior to the abdominal closure and at the conclusion of the case.  The patient tolerated the procedure well and was transferred to the recovery room in stable condition.   [name] MD [facility], [facility] 05/19/19 11:58 PM

## 2019-05-19 NOTE — Transfer of Care (Signed)
Immediate Anesthesia Transfer of Care Note  Patient: Stacey Dunn  Procedure(s) Performed: CESAREAN SECTION- Repeat SEX: FEMALE TOB 2303 weight 9lbs 0oz apgars: 47min:9, 70min:9 (N/A Abdomen)  Patient Location: PACU  Anesthesia Type:Spinal  Level of Consciousness: awake, alert  and oriented  Airway & Oxygen Therapy: Patient Spontanous Breathing  Post-op Assessment: Report given to RN and Post -op Vital signs reviewed and stable  Post vital signs: Reviewed and stable  Last Vitals:  Vitals Value Taken Time  BP    Temp    Pulse    Resp    SpO2      Last Pain:  Vitals:   05/19/19 2122  TempSrc: Oral         Complications: No apparent anesthesia complications

## 2019-05-19 NOTE — OB Triage Note (Signed)
Patient here for contractions that have worsened over the past 24 hours to about 4 minutes apart. They do ease off at times but have gotten progressively worse in intensity, denies LOF and bleeding. 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

## 2019-05-19 NOTE — Anesthesia Procedure Notes (Signed)
Spinal  Patient location during procedure: OB Start time: 05/19/2019 10:50 PM Staffing Anesthesiologist: Alvin Critchley, MD Resident/CRNA: Henrique Parekh, Einar Grad, CRNA Preanesthetic Checklist Completed: patient identified, site marked, surgical consent, pre-op evaluation, timeout performed, IV checked, risks and benefits discussed and monitors and equipment checked Spinal Block Patient position: sitting Prep: DuraPrep Patient monitoring: heart rate, cardiac monitor, continuous pulse ox and blood pressure Approach: midline Location: L3-4 Injection technique: single-shot Needle Needle type: Sprotte  Needle gauge: 24 G Needle length: 9 cm Assessment Sensory level: T4

## 2019-05-19 NOTE — Progress Notes (Signed)
Pharmacy Antibiotic Note  Stacey Dunn is a 38 y.o. female admitted on 05/19/2019 with cellulitis.  Pharmacy has been consulted for Vancomycin dosing.  Plan: Vancomycin 2 gm IV X 1 given on 8/28 @ 2100. Vancomycin 1 gm Iv Q12H ordered to start on 8/29 @ 0900. No peak or trough currently ordered.   CrCl = 115.4 ml/min Ke = 0.075 hr-1 Vd = 52.2 L  T1/2 = 9.3   AUC = 513.1 Vanc trough = 14.2   Height: 5\' 4"  (162.6 cm) Weight: 230 lb (104.3 kg) IBW/kg (Calculated) : 54.7  Temp (24hrs), Avg:98.5 F (36.9 C), Min:98.5 F (36.9 C), Max:98.5 F (36.9 C)  Recent Labs  Lab 05/19/19 1901  WBC 15.1*  CREATININE 0.61    Estimated Creatinine Clearance: 115.4 mL/min (by C-G formula based on SCr of 0.61 mg/dL).    Allergies  Allergen Reactions  . Meloxicam Swelling  . Morphine And Related   . Penicillins     Hives Did it involve swelling of the face/tongue/throat, SOB, or low BP? No Did it involve sudden or severe rash/hives, skin peeling, or any reaction on the inside of your mouth or nose? Yes Did you need to seek medical attention at a hospital or doctor's office? Yes When did it last happen?childhood  If all above answers are "NO", may proceed with cephalosporin use.   Marland Kitchen Morphine Hives, Other (See Comments) and Rash    hallucinations   . Sulfa Antibiotics Hives, Other (See Comments) and Rash    unknown     Antimicrobials this admission:   >>    >>  Dose adjustments this admission:   Microbiology results:  BCx:   UCx:   Sputum:    MRSA PCR:   Thank you for allowing pharmacy to be a part of this patient's care.  Stacey Dunn D 05/19/2019 10:22 PM

## 2019-05-19 NOTE — H&P (Signed)
History & Physical  Stacey Dunn is an 38 y.o. female.  HPI: Stacey Dunn presents to triage complaining of worsening contractions. She feels the contractions every 2-4 minutes. She is feeling very anxious regarding her contractions and the possibility of a cesarean section sooner than wheat she had planned. She has not had any food to eat today since this morning at 6am.    She denies headache. She denies vision changes. She denies RUQ pain.   She denies leakage of fluid. She denies vaginal bleeding. She is feeling fetal movement.   She also reports a facial infection. She says "I grow a beard better than most me" and she has an infected follicle on the right side of her chin. This started 2 days ago. She has tried keeping it clean and using neosporin. She is feeling pain and swelling that extends along her jaw line towards her ear.  She has not had a fever or chills.   OB History  Gravida Para Term Preterm AB Living  2 1 1     1   SAB TAB Ectopic Multiple Live Births          1    # Outcome Date GA Lbr Len/2nd Weight Sex Delivery Anes PTL Lv  2 Current           1 Term 05/11/06 [redacted]w[redacted]d  4281 g F CS-LTranv   LIV     Complications: PIH (pregnancy induced hypertension), Failed induction of labor     Past Medical History:  Diagnosis Date  . 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 Bipolar 2 disorder (HCC)    no meds currently  . Caries   . Constipation   . COPD (chronic obstructive pulmonary disease) (HCC)   . Depression    History -no meds  . History of borderline personality disorder    no meds currently  . History of chest pain    Per client, eval at [facility] ED 2017 - 2018. "Nodules right lung."  No follow-up by client.  . Hypertension    History "PIH"  . Migraine    History  . Nausea & vomiting    associated with pregnancy  . Varicosities of leg   . Wears partial dentures    Upper    Past Surgical History:  Procedure  Laterality Date  . ANKLE SURGERY Right    For Staph infection  . CESAREAN SECTION      Family History  Problem Relation Age of Onset  . Kidney disease Mother   . Renal Disease Mother   . Hypertension Mother   . Alcohol abuse Father   . Hypertension Sister   . Cancer Sister   . Heart Problems Sister   . Cancer Maternal Uncle   . Cancer Maternal Grandfather   . Cancer Paternal Grandfather   . Diabetes Paternal Grandfather   . Epilepsy Sister   . Migraines Sister     Social History:  reports that she has been smoking cigarettes. She has a 5.25 pack-year smoking history. She has never used smokeless tobacco. She reports previous alcohol use. She reports current drug use. Drug: Marijuana.  Allergies:  Allergies  Allergen Reactions  . Meloxicam Swelling  . Morphine And Related   . Penicillins     Hives Did it involve swelling of the face/tongue/throat, SOB, or low BP? No Did it involve sudden or severe rash/hives, skin peeling, or any reaction  on the inside of your mouth or nose? Yes Did you need to seek medical attention at a hospital or doctor's office? Yes When did it last happen?childhood  If all above answers are "NO", may proceed with cephalosporin use.   Marland Kitchen Morphine Hives, Other (See Comments) and Rash    hallucinations   . Sulfa Antibiotics Hives, Other (See Comments) and Rash    unknown     Medications: I have reviewed the patient's current medications.  Results for orders placed or performed during the hospital encounter of 05/19/19 (from the past 48 hour(s))  CBC     Status: Abnormal   Collection Time: 05/19/19  7:01 PM  Result Value Ref Range   WBC 15.1 (H) 4.0 - 10.5 K/uL   RBC 5.48 (H) 3.87 - 5.11 MIL/uL   Hemoglobin 15.8 (H) 12.0 - 15.0 g/dL   HCT 46.6 (H) 36.0 - 46.0 %   MCV 85.0 80.0 - 100.0 fL   MCH 28.8 26.0 - 34.0 pg   MCHC 33.9 30.0 - 36.0 g/dL   RDW 13.7 11.5 - 15.5 %   Platelets 169 150 - 400 K/uL   nRBC 0.0 0.0 - 0.2 %    Comment:  Performed at [facility], [address] [place], [place] [address]  Comprehensive metabolic panel     Status: Abnormal   Collection Time: 05/19/19  7:01 PM  Result Value Ref Range   Sodium 135 135 - 145 mmol/L   Potassium 3.4 (L) 3.5 - 5.1 mmol/L   Chloride 103 98 - 111 mmol/L   CO2 20 (L) 22 - 32 mmol/L   Glucose, Bld 85 70 - 99 mg/dL   BUN 6 6 - 20 mg/dL   Creatinine, Ser 0.61 0.44 - 1.00 mg/dL   Calcium 8.7 (L) 8.9 - 10.3 mg/dL   Total Protein 7.0 6.5 - 8.1 g/dL   Albumin 2.9 (L) 3.5 - 5.0 g/dL   AST 30 15 - 41 U/L   ALT 22 0 - 44 U/L   Alkaline Phosphatase 138 (H) 38 - 126 U/L   Total Bilirubin 0.8 0.3 - 1.2 mg/dL   GFR calc non Af Amer >60 >60 mL/min   GFR calc Af Amer >60 >60 mL/min   Anion gap 12 5 - 15    Comment: Performed at [facility], [address] [place], [place] [address]  Protein / creatinine ratio, urine     Status: Abnormal   Collection Time: 05/19/19  7:12 PM  Result Value Ref Range   Creatinine, Urine 80 mg/dL   Total Protein, Urine 22 mg/dL    Comment: NO NORMAL RANGE ESTABLISHED FOR THIS TEST   Protein Creatinine Ratio 0.28 (H) 0.00 - 0.15 mg/mg[Cre]    Comment: Performed at [facility], [address] [place], [place] [address]    No results found.  Review of Systems  Constitutional: Negative for chills, fever, malaise/fatigue and weight loss.  HENT: Negative for congestion, hearing loss and sinus pain.   Eyes: Negative for blurred vision and double vision.  Respiratory: Negative for cough, sputum production, shortness of breath and wheezing.   Cardiovascular: Negative for chest pain, palpitations, orthopnea and leg swelling.  Gastrointestinal: Negative for abdominal pain, constipation, diarrhea, nausea and vomiting.  Genitourinary: Negative for dysuria, flank pain, frequency, hematuria and urgency.  Musculoskeletal: Negative for back pain, falls and joint pain.  Skin: Negative for itching and rash.   Neurological: Negative for dizziness and headaches.  Psychiatric/Behavioral: Negative for  depression, substance abuse and suicidal ideas. The patient is not nervous/anxious.    Blood pressure (!) 136/93, pulse (!) 107, last menstrual period 08/21/2018. Physical Exam  Nursing note and vitals reviewed. Constitutional: She is oriented to person, place, and time. She appears well-developed and well-nourished.  HENT:  Head: Normocephalic and atraumatic.    Outlined area of Induration  Cardiovascular: Normal rate and regular rhythm.  Respiratory: Effort normal and breath sounds normal.  GI: Soft. Bowel sounds are normal.  Musculoskeletal: Normal range of motion.  Neurological: She is alert and oriented to person, place, and time.  Skin: Skin is warm and dry.  Psychiatric: She has a normal mood and affect. Her behavior is normal. Judgment and thought content normal.   NST: 150 bpm baseline, moderate variability, 15x15 accelerations, no decelerations. Tocometer : q2-4 minutes  Assessment/Plan: 38 yo G2P1001 3745w0d 1. Elevated BP- Will obtain Pre-E labs and monitor Bps 2. Facial folliculitis- will treat with IV antibiotics. Start IV vancomycin for severe facial cellulitis 3. History of prior LTCS, painfully contracting despite IV fluids. Is term. Will proceed with urgent cesarean section for labor.    Christanna R Schuman 05/19/2019, 9:18 PM

## 2019-05-20 LAB — CBC
HCT: 38.7 % (ref 36.0–46.0)
Hemoglobin: 13.3 g/dL (ref 12.0–15.0)
MCH: 29 pg (ref 26.0–34.0)
MCHC: 34.4 g/dL (ref 30.0–36.0)
MCV: 84.5 fL (ref 80.0–100.0)
Platelets: 145 10*3/uL — ABNORMAL LOW (ref 150–400)
RBC: 4.58 MIL/uL (ref 3.87–5.11)
RDW: 13.4 % (ref 11.5–15.5)
WBC: 11.8 10*3/uL — ABNORMAL HIGH (ref 4.0–10.5)
nRBC: 0 % (ref 0.0–0.2)

## 2019-05-20 LAB — FETAL SCREEN: Fetal Screen: NEGATIVE

## 2019-05-20 LAB — ABO/RH: ABO/RH(D): O NEG

## 2019-05-20 MED ORDER — PRENATAL MULTIVITAMIN CH
1.0000 | ORAL_TABLET | Freq: Every day | ORAL | Status: DC
  Administered 2019-05-20 – 2019-05-23 (×4): 1 via ORAL
  Filled 2019-05-20 (×4): qty 1

## 2019-05-20 MED ORDER — ONDANSETRON HCL 4 MG/2ML IJ SOLN: 4.0000 mg | Freq: Once | INTRAMUSCULAR | Status: DC | PRN

## 2019-05-20 MED ORDER — SIMETHICONE 80 MG PO CHEW: 80.0000 mg | CHEWABLE_TABLET | ORAL | Status: DC | PRN

## 2019-05-20 MED ORDER — OXYCODONE HCL 5 MG PO TABS
5.0000 mg | ORAL_TABLET | ORAL | Status: DC | PRN
  Administered 2019-05-20: 04:00:00 5 mg via ORAL
  Administered 2019-05-20 – 2019-05-22 (×10): 10 mg via ORAL
  Administered 2019-05-22: 5 mg via ORAL
  Administered 2019-05-22: 10 mg via ORAL
  Administered 2019-05-23 (×2): 5 mg via ORAL
  Filled 2019-05-20 (×2): qty 2
  Filled 2019-05-20 (×2): qty 1
  Filled 2019-05-20 (×4): qty 2
  Filled 2019-05-20: qty 1
  Filled 2019-05-20: qty 2
  Filled 2019-05-20: qty 1
  Filled 2019-05-20 (×4): qty 2

## 2019-05-20 MED ORDER — SIMETHICONE 80 MG PO CHEW
80.0000 mg | CHEWABLE_TABLET | Freq: Three times a day (TID) | ORAL | Status: DC
  Administered 2019-05-20 – 2019-05-23 (×11): 80 mg via ORAL
  Filled 2019-05-20 (×9): qty 1

## 2019-05-20 MED ORDER — ACETAMINOPHEN 500 MG PO TABS
1000.0000 mg | ORAL_TABLET | Freq: Four times a day (QID) | ORAL | Status: DC
  Administered 2019-05-20 – 2019-05-21 (×6): 1000 mg via ORAL
  Filled 2019-05-20 (×6): qty 2

## 2019-05-20 MED ORDER — BUPIVACAINE ON-Q PAIN PUMP (FOR ORDER SET NO CHG)
INJECTION | Status: AC
  Filled 2019-05-20: qty 1

## 2019-05-20 MED ORDER — IBUPROFEN 800 MG PO TABS
800.0000 mg | ORAL_TABLET | Freq: Three times a day (TID) | ORAL | Status: DC
  Administered 2019-05-20 – 2019-05-21 (×5): 800 mg via ORAL
  Filled 2019-05-20 (×5): qty 1

## 2019-05-20 MED ORDER — FENTANYL CITRATE (PF) 100 MCG/2ML IJ SOLN
25.0000 ug | INTRAMUSCULAR | Status: AC | PRN
  Administered 2019-05-20 (×6): 25 ug via INTRAVENOUS
  Filled 2019-05-20: qty 2

## 2019-05-20 MED ORDER — HYDROMORPHONE HCL 1 MG/ML IJ SOLN: 0.2000 mg | INTRAMUSCULAR | Status: DC | PRN

## 2019-05-20 MED ORDER — COCONUT OIL OIL
1.0000 "application " | TOPICAL_OIL | Status: DC | PRN
  Administered 2019-05-21: 1 via TOPICAL
  Filled 2019-05-20: qty 120

## 2019-05-20 MED ORDER — OXYTOCIN 40 UNITS IN NORMAL SALINE INFUSION - SIMPLE MED
2.5000 [IU]/h | INTRAVENOUS | Status: AC
  Administered 2019-05-20: 04:00:00 2.5 [IU]/h via INTRAVENOUS
  Filled 2019-05-20: qty 1000

## 2019-05-20 MED ORDER — SODIUM CHLORIDE 0.9 % IV SOLN
INTRAVENOUS | Status: DC | PRN
  Administered 2019-05-20 – 2019-05-22 (×4): via INTRAVENOUS

## 2019-05-20 MED ORDER — FENTANYL CITRATE (PF) 100 MCG/2ML IJ SOLN
INTRAMUSCULAR | Status: AC
  Administered 2019-05-20: 25 ug via INTRAVENOUS
  Filled 2019-05-20: qty 2

## 2019-05-20 MED ORDER — OXYTOCIN 40 UNITS IN NORMAL SALINE INFUSION - SIMPLE MED
2.5000 [IU]/h | INTRAVENOUS | Status: DC
  Administered 2019-05-20: 2.5 [IU]/h via INTRAVENOUS

## 2019-05-20 MED ORDER — VANCOMYCIN HCL IN DEXTROSE 1-5 GM/200ML-% IV SOLN
1000.0000 mg | Freq: Two times a day (BID) | INTRAVENOUS | Status: DC
  Administered 2019-05-20 – 2019-05-23 (×6): 1000 mg via INTRAVENOUS
  Filled 2019-05-20 (×8): qty 200

## 2019-05-20 MED ORDER — DIPHENHYDRAMINE HCL 25 MG PO CAPS: 25.0000 mg | ORAL_CAPSULE | Freq: Four times a day (QID) | ORAL | Status: DC | PRN

## 2019-05-20 MED ORDER — RHO D IMMUNE GLOBULIN 1500 UNIT/2ML IJ SOSY
300.0000 ug | PREFILLED_SYRINGE | Freq: Once | INTRAMUSCULAR | Status: AC
  Administered 2019-05-20: 21:00:00 300 ug via INTRAVENOUS
  Filled 2019-05-20: qty 2

## 2019-05-20 MED ORDER — LACTATED RINGERS IV SOLN: INTRAVENOUS | Status: DC

## 2019-05-20 MED ORDER — BISACODYL 10 MG RE SUPP: 10.0000 mg | Freq: Every day | RECTAL | Status: DC | PRN

## 2019-05-20 MED ORDER — FLEET ENEMA 7-19 GM/118ML RE ENEM: 1.0000 | ENEMA | Freq: Every day | RECTAL | Status: DC | PRN

## 2019-05-20 MED ORDER — MENTHOL 3 MG MT LOZG
1.0000 | LOZENGE | OROMUCOSAL | Status: DC | PRN
  Filled 2019-05-20: qty 9

## 2019-05-20 MED ORDER — SENNOSIDES-DOCUSATE SODIUM 8.6-50 MG PO TABS
2.0000 | ORAL_TABLET | ORAL | Status: DC
  Administered 2019-05-21 – 2019-05-23 (×3): 2 via ORAL
  Filled 2019-05-20 (×3): qty 2

## 2019-05-20 MED ORDER — SIMETHICONE 80 MG PO CHEW
80.0000 mg | CHEWABLE_TABLET | ORAL | Status: DC
  Filled 2019-05-20 (×2): qty 1

## 2019-05-20 MED ORDER — FERROUS SULFATE 325 (65 FE) MG PO TABS
325.0000 mg | ORAL_TABLET | Freq: Two times a day (BID) | ORAL | Status: DC
  Administered 2019-05-20 – 2019-05-23 (×7): 325 mg via ORAL
  Filled 2019-05-20 (×7): qty 1

## 2019-05-20 MED ORDER — ZOLPIDEM TARTRATE 5 MG PO TABS: 5.0000 mg | ORAL_TABLET | Freq: Every evening | ORAL | Status: DC | PRN

## 2019-05-20 NOTE — Progress Notes (Addendum)
Patient's husband sleeping in chair with infant on his chest. RN asked dad to place infant in bassinet. Dad stated "I slept with my son on my chest everyday and we were fine. We'll be fine." Patient and husband educated about safe sleep. Patient asked husband to stay awake. Husband back to sleep with infant. RN to continue to monitor and continue to re-educate.

## 2019-05-20 NOTE — Discharge Summary (Addendum)
OB Discharge Summary     Patient Name: Stacey Dunn DOB: 12/20/1980 MRN: [id number]  Date of admission: 05/19/2019 Delivering MD: Natale Milchhristanna R Schuman, MD  Date of Delivery: 05/19/2019  Date of discharge: 05/23/2019  Admitting diagnosis: 39 weeks preg contractions Intrauterine pregnancy: 6960w1d     Secondary diagnosis: folliculitis/cellulitis of right chin     Discharge diagnosis: Term Pregnancy Delivered, Reasons for cesarean section  Elective repeat                         Hospital course:  Onset of Labor With Unplanned C/S  38 y.o. yo G2P1001 at 6760w1d was admitted in Latent Labor on 05/19/2019. Patient had a labor course significant for regular contractions every 2-4 minutes despite hydration. Membrane Rupture Time/Date: 11:01 PM ,05/19/2019   The patient went for cesarean section due to Elective Repeat, and delivered a Viable infant,05/19/2019  Details of operation can be found in separate operative note. Patient had a postpartum course complicated by wound on right chin that patient states started as an ingrown hair. She received IV Vancomycin q 12 hours. She will be discharged with PO clindamycin and bactroban. She had a consultation from Infectious Diseases.  After the culture results from her cellulitis, it is recommended that she be discharged on clindamycin and follow up with Infectious Disease in one week.  She is ambulating,tolerating a regular diet, passing flatus, and urinating well.  Patient is discharged home in stable condition 05/23/19.                                                                 Post partum procedures: rhogam, IV Vancomycin for cellulitis of the face, Infectious Disease Consultation  Complications: None  Physical exam on 05/23/2019: Vitals:   05/22/19 1539 05/22/19 1630 05/22/19 2356 05/23/19 0727  BP:  130/85 127/76 (!) 142/81  Pulse:  87 85 87  Resp:  18 18 15   Temp:  98.3 F (36.8 C) 98.5 F (36.9 C) 97.8 F (36.6 C)  TempSrc:  Oral Oral Oral   SpO2:  100% 99% 99%  Weight: 101.3 kg     Height:       General: alert, cooperative and no distress Lochia: appropriate Uterine Fundus: firm Incision: Healing well with no significant drainage, Dressing is clean, dry, and intact, On Q pump intact DVT Evaluation: No evidence of DVT seen on physical exam.  HEENT: wound on right chin is ~1 cm x 3/4 cm x 1/2 cm deep with small amount of purulent drainage and redness localized around wound. Wound is covered with dressing.  See Wound culture results.  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  Labs: Lab Results  Component Value Date   WBC 5.6 05/23/2019   HGB 12.4 05/23/2019   HCT 36.7 05/23/2019   MCV 86.2 05/23/2019   PLT 238 05/23/2019   CMP Latest Ref Rng & Units 05/23/2019  Glucose 70 - 99 mg/dL 89  BUN 6 - 20 mg/dL 10  Creatinine 4.090.44 - 8.111.00 mg/dL 9.140.64  Sodium 782135 - 956145 mmol/L 142  Potassium 3.5 - 5.1 mmol/L 3.3(L)  Chloride 98 - 111 mmol/L 107  CO2 22 - 32 mmol/L 26  Calcium 8.9 - 10.3 mg/dL 8.3(L)  Total Protein 6.5 - 8.1  g/dL -  Total Bilirubin 0.3 - 1.2 mg/dL -  Alkaline Phos 38 - 126 U/L -  AST 15 - 41 U/L -  ALT 0 - 44 U/L -    Discharge instruction: per After Visit Summary. Wash hands well after wound care prior to holding/breastfeeding baby.  Medications:  Allergies as of 05/23/2019      Reactions   Meloxicam Swelling   Morphine And Related    Penicillins    Hives Did it involve swelling of the face/tongue/throat, SOB, or low BP? No Did it involve sudden or severe rash/hives, skin peeling, or any reaction on the inside of your mouth or nose? Yes Did you need to seek medical attention at a hospital or doctor's office? Yes When did it last happen?childhood  If all above answers are "NO", may proceed with cephalosporin use.   Morphine Hives, Other (See Comments), Rash   hallucinations   Sulfa Antibiotics Hives, Other (See Comments), Rash   unknown      Medication List    STOP taking these medications    aspirin EC 81 MG tablet     TAKE these medications   clindamycin 150 MG capsule Commonly known as: CLEOCIN Take 3 capsules (450 mg total) by mouth 3 (three) times daily for 10 days.   mupirocin ointment 2 % Commonly known as: BACTROBAN Apply topically 2 (two) times daily.   norethindrone 0.35 MG tablet Commonly known as: MICRONOR Take 1 tablet (0.35 mg total) by mouth daily. Start taking on: June 04, 2019   oxyCODONE 5 MG immediate release tablet Commonly known as: Oxy IR/ROXICODONE Take 1-2 tablets (5-10 mg total) by mouth every 6 (six) hours as needed for up to 5 days for moderate pain or severe pain.   prenatal multivitamin Tabs tablet Take 1 tablet by mouth daily.            Discharge Care Instructions  (From admission, onward)         Start     Ordered   05/22/19 0000  Discharge wound care:    Comments: Keep incision dry, clean.   05/22/19 0931          Diet: routine diet  Activity: Advance as tolerated. Pelvic rest for 6 weeks.   Outpatient follow up: Follow-up Information    Schuman, Stefanie Libel, MD. Schedule an appointment as soon as possible for a visit in 1 week.   Specialty: Obstetrics and Gynecology Why: For incision check and wound check Contact information: [address] [place] [address] [phone]             Postpartum contraception: Progesterone only pills Rhogam Given postpartum: yes Rubella vaccine given postpartum: no Varicella vaccine given postpartum: no TDaP given antepartum or postpartum: Yes  Newborn Data: Live born female  Birth Weight: 8 lb 15.9 oz (4080 g) APGAR: 9, 9  Newborn Delivery   Birth date/time: 05/19/2019 23:03:00 Delivery type: C-Section, Vacuum Assisted Trial of labor: No C-section categorization: Repeat       Baby Feeding: Breast  Disposition:home with mother  SIGNED:  Prentice Docker, MD 05/23/2019 3:16 PM

## 2019-05-20 NOTE — Lactation Note (Signed)
This note was copied from a baby's chart. Lactation Consultation Note  Patient Name: Girl Ethyl Vila Today's Date: 05/20/2019 Reason for consult: Initial assessment;Mother's request;Term   Maternal Data Formula Feeding for Exclusion: No Has patient been taught Hand Expression?: Yes(Can easily hand express copious amounts of colostrum) Does the patient have breastfeeding experience prior to this delivery?: Yes  Feeding Feeding Type: Breast Fed  LATCH Score Latch: Repeated attempts needed to sustain latch, nipple held in mouth throughout feeding, stimulation needed to elicit sucking reflex.  Audible Swallowing: A few with stimulation  Type of Nipple: Everted at rest and after stimulation  Comfort (Breast/Nipple): Soft / non-tender  Hold (Positioning): Assistance needed to correctly position infant at breast and maintain latch.  LATCH Score: 7  Interventions Interventions: Breast feeding basics reviewed;Assisted with latch;Skin to skin;Breast massage;Hand express;Reverse pressure;Breast compression;Adjust position;Support pillows;Position options  Lactation Tools Discussed/Used WIC Program: Yes   Consult Status Consult Status: Follow-up Date: 05/20/19 Follow-up type: Call as needed    Jarold Motto 05/20/2019, 3:00 PM

## 2019-05-20 NOTE — Lactation Note (Signed)
This note was copied from a baby's chart. Lactation Consultation Note  Patient Name: Stacey Dunn Today's Date: 05/20/2019 Reason for consult: Initial assessment;Mother's request;Term  Assisted mom in comfortable position with pillow support in cradle hold initially.  [name] was crying and squirming.  Once we placed her in football hold, Mom and [name] appeared more comfortable.  [name] has slight tight frenulum.  Pointed out to mom and encouraged mom to discuss with Pediatrician.   Even at rest Evelyn curls in lower lip.  Demonstrated to mom how to gently press on chin to flip out lower lip.  Once deep latch was achieved, [name] began strong rhythmic sucking with swallows.  Reviewed feeding cues and encouraged mom to put [name] to the breast whenever she demonstrated hunger cues.  Discussed newborn stomach size, supply and demand, normal course of lactation and routine newborn feeding patterns.  Lactation name and number written on white board and encouraged to call with any questions, concerns or assistance.  Maternal Data Formula Feeding for Exclusion: No Has patient been taught Hand Expression?: Yes(Can easily hand express copious amounts of colostrum) Does the patient have breastfeeding experience prior to this delivery?: Yes  Feeding Feeding Type: Breast Fed  LATCH Score Latch: Repeated attempts needed to sustain latch, nipple held in mouth throughout feeding, stimulation needed to elicit sucking reflex.  Audible Swallowing: A few with stimulation  Type of Nipple: Everted at rest and after stimulation  Comfort (Breast/Nipple): Soft / non-tender  Hold (Positioning): Assistance needed to correctly position infant at breast and maintain latch.  LATCH Score: 7  Interventions Interventions: Breast feeding basics reviewed;Assisted with latch;Skin to skin;Breast massage;Hand express;Reverse pressure;Breast compression;Adjust position;Support pillows;Position options  Lactation  Tools Discussed/Used WIC Program: Yes   Consult Status Consult Status: Follow-up Date: 05/20/19 Follow-up type: Call as needed    Jarold Motto 05/20/2019, 2:28 PM

## 2019-05-20 NOTE — Progress Notes (Signed)
POD#1 rLTCS Subjective:  Stacey Dunn is sitting up in bed, holding her baby, NAD. Reports fatigue and incisional pain. Foley catheter in place; SCDs on. Will be up to ambulate soon. Tolerating PO.    Objective:  Blood pressure 123/73, pulse 82, temperature 98.7 F (37.1 C), temperature source Oral, resp. rate 18, height 5\' 4"  (1.626 m), weight 104.3 kg, last menstrual period 08/21/2018, SpO2 99 %, unknown if currently breastfeeding.  General: NAD Pulmonary: no increased work of breathing, CTAB Abdomen: non-distended, non-tender, fundus firm at level of umbilicus Incision: Dressing C/D/I; On-Q in place Extremities: no signs of DVT, continuing SCDs in bed  Results for orders placed or performed during the hospital encounter of 05/19/19 (from the past 72 hour(s))  CBC     Status: Abnormal   Collection Time: 05/19/19  7:01 PM  Result Value Ref Range   WBC 15.1 (H) 4.0 - 10.5 K/uL   RBC 5.48 (H) 3.87 - 5.11 MIL/uL   Hemoglobin 15.8 (H) 12.0 - 15.0 g/dL   HCT 16.146.6 (H) 09.636.0 - 04.546.0 %   MCV 85.0 80.0 - 100.0 fL   MCH 28.8 26.0 - 34.0 pg   MCHC 33.9 30.0 - 36.0 g/dL   RDW 40.913.7 81.111.5 - 91.415.5 %   Platelets 169 150 - 400 K/uL   nRBC 0.0 0.0 - 0.2 %    Comment: Performed at [facility], [address] [place], [place] [address]  Comprehensive metabolic panel     Status: Abnormal   Collection Time: 05/19/19  7:01 PM  Result Value Ref Range   Sodium 135 135 - 145 mmol/L   Potassium 3.4 (L) 3.5 - 5.1 mmol/L   Chloride 103 98 - 111 mmol/L   CO2 20 (L) 22 - 32 mmol/L   Glucose, Bld 85 70 - 99 mg/dL   BUN 6 6 - 20 mg/dL   Creatinine, Ser 6.210.61 0.44 - 1.00 mg/dL   Calcium 8.7 (L) 8.9 - 10.3 mg/dL   Total Protein 7.0 6.5 - 8.1 g/dL   Albumin 2.9 (L) 3.5 - 5.0 g/dL   AST 30 15 - 41 U/L   ALT 22 0 - 44 U/L   Alkaline Phosphatase 138 (H) 38 - 126 U/L   Total Bilirubin 0.8 0.3 - 1.2 mg/dL   GFR calc non Af Amer >60 >60 mL/min   GFR calc Af Amer >60 >60 mL/min   Anion gap 12 5 - 15   Comment: Performed at [facility], [address] [place], [place] [address]  Protein / creatinine ratio, urine     Status: Abnormal   Collection Time: 05/19/19  7:12 PM  Result Value Ref Range   Creatinine, Urine 80 mg/dL   Total Protein, Urine 22 mg/dL    Comment: NO NORMAL RANGE ESTABLISHED FOR THIS TEST   Protein Creatinine Ratio 0.28 (H) 0.00 - 0.15 mg/mg[Cre]    Comment: Performed at [facility], [address] [place], [place] [address]  Type and screen [facility]     Status: None (Preliminary result)   Collection Time: 05/19/19  7:13 PM  Result Value Ref Range   ABO/RH(D) O NEG    Antibody Screen POS    Sample Expiration 05/22/2019,2359    Antibody Identification PASSIVELY ACQUIRED ANTI-D    Unit Number [id number]    Blood Component Type RBC LR PHER2    Unit division 00    Status of Unit ALLOCATED    Transfusion Status OK TO TRANSFUSE  Crossmatch Result      COMPATIBLE Performed at [facility], [address] [place], [place] [address]    Unit Number [id number]    Blood Component Type RED CELLS,LR    Unit division 00    Status of Unit ALLOCATED    Transfusion Status OK TO TRANSFUSE    Crossmatch Result COMPATIBLE   CBC     Status: Abnormal   Collection Time: 05/20/19  4:28 AM  Result Value Ref Range   WBC 11.8 (H) 4.0 - 10.5 K/uL   RBC 4.58 3.87 - 5.11 MIL/uL   Hemoglobin 13.3 12.0 - 15.0 g/dL   HCT 38.7 36.0 - 46.0 %   MCV 84.5 80.0 - 100.0 fL   MCH 29.0 26.0 - 34.0 pg   MCHC 34.4 30.0 - 36.0 g/dL   RDW 13.4 11.5 - 15.5 %   Platelets 145 (L) 150 - 400 K/uL   nRBC 0.0 0.0 - 0.2 %    Comment: Performed at [facility] [address] [place], [place] [address]  ABO/Rh     Status: None   Collection Time: 05/20/19  4:28 AM  Result Value Ref Range   ABO/RH(D)      Jenetta Downer NEG Performed at [facility], [address], [place], [place] [address]   Fetal screen     Status: None    Collection Time: 05/20/19  4:28 AM  Result Value Ref Range   Fetal Screen      NEG Performed at [facility], [place], [place] [address]   Rhogam injection     Status: None (Preliminary result)   Collection Time: 05/20/19  4:28 AM  Result Value Ref Range   Unit Number [id number]    Blood Component Type RHIG    Unit division 00    Status of Unit ALLOCATED    Transfusion Status OK TO TRANSFUSE      Assessment:   38 y.o. [id number] postoperative day #1 in stable condition, recovering well.   Plan:  1) Acute blood loss anemia - hemodynamically stable and asymptomatic - PO ferrous sulfate  2) Blood Type --/--/O NEG Performed at [facility], [address] [place],  [address]  [phone]) / Charlynn Grimes Immune (01/28 0000) / Varicella Immune  Information for the patient's newborn:  Stacey Dunn [[id number]]  A POS  RhoGAM ordered  3) TDAP status: received antepartum 11/24/2018  4) Breastfeeding/Contraception: vasectomy planned  5) Disposition: continue postoperative care.  Avel Sensor, CNM 05/20/2019  9:26 AM

## 2019-05-20 NOTE — Progress Notes (Signed)
Discussed with patient her morphine allergy. She reports that she had itching and a rash after taking liquid morphine more than 10 years ago. She would like to try opioid pain medicine and will discontinue them if there is a problem.   Stacey Prows MD [facility] 05/20/2019 12:18 AM

## 2019-05-21 ENCOUNTER — Encounter: Payer: Self-pay | Admitting: Obstetrics and Gynecology

## 2019-05-21 LAB — RHOGAM INJECTION: Unit division: 0

## 2019-05-21 LAB — BPAM RBC
Blood Product Expiration Date: 202009192359
Blood Product Expiration Date: 202009242359
Unit Type and Rh: 9500
Unit Type and Rh: 9500

## 2019-05-21 LAB — TYPE AND SCREEN
ABO/RH(D): O NEG
Antibody Screen: POSITIVE
Unit division: 0
Unit division: 0

## 2019-05-21 MED ORDER — MUPIROCIN 2 % EX OINT
TOPICAL_OINTMENT | Freq: Two times a day (BID) | CUTANEOUS | Status: DC
  Administered 2019-05-21 – 2019-05-23 (×4): via TOPICAL
  Filled 2019-05-21: qty 22

## 2019-05-21 MED ORDER — IBUPROFEN 800 MG PO TABS
800.0000 mg | ORAL_TABLET | Freq: Three times a day (TID) | ORAL | Status: AC
  Administered 2019-05-21 – 2019-05-22 (×4): 800 mg via ORAL
  Filled 2019-05-21 (×4): qty 1

## 2019-05-21 MED ORDER — ACETAMINOPHEN 500 MG PO TABS
1000.0000 mg | ORAL_TABLET | Freq: Four times a day (QID) | ORAL | Status: DC
  Administered 2019-05-21 – 2019-05-23 (×7): 1000 mg via ORAL
  Filled 2019-05-21 (×7): qty 2

## 2019-05-21 NOTE — Lactation Note (Signed)
This note was copied from a baby's chart. Lactation Consultation Note  Patient Name: Stacey Dunn Today's Date: 05/21/2019   Mom has been anxious and tearful most of today.  Mom has not got much sleep since birth.  Mom is concerned that Stacey Dunn is not staying on the breast long enough to get sufficient amount of breast milk.  Stacey Dunn breast feeds well on left breast, but is struggling to maintain latch on right breast coming on and off the breast. Mom has folliculits on chin that is painful, open and draining.  Area of concern is covered by bandage.  Mom is getting IV Vancomycin.  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  Discussed with mom importance of good hand washing and not touching area while holding, breast feeding or pumping.  Father of baby wanting to be discharged.  Mom wanting to stay to work on breast feeding and continue to get IV Vancomycin for follicutlitis.  Once father of baby left room, mom had melt down.  Allowed mom time to discuss concerns.  Assisted mom with pumping earlier.  Reviewed breast massage, hand expressing, pumping, collection, storage, labeling, cleaning, feeding and handling of expressed milk.  Mom expressed 14 ml which was fingerfed via curved tip syringe.  Stacey Dunn seemed to breast feed better at next feeding, but still fussing and on and off of right breast sometimes getting shallow latch when re latching.  Left nipple is getting sore with small trauma on top of nipple.  Mom has rotated feeding positions which seems to help some with discomfort.  Mom using coconut oil on nipples and around flange of pump.  Lactation name and number written on white board and encouraged to call with any questions, concerns or assistance.      Maternal Data    Feeding Feeding Type: Breast Fed  LATCH Score                   Interventions    Lactation Tools Discussed/Used     Consult Status      Stacey Dunn 05/21/2019, 10:40 PM

## 2019-05-21 NOTE — Progress Notes (Signed)
RN spoke with pharmacist about whether patient needs Vancomycin troughs or not. Pharmacist states they will contact provider, Deeann Saint.

## 2019-05-21 NOTE — Anesthesia Postprocedure Evaluation (Signed)
Anesthesia Post Note  Patient: Public affairs consultant  Procedure(s) Performed: CESAREAN SECTION- Repeat SEX: FEMALE TOB 2303 weight 9lbs 0oz apgars: 55min:9, 43min:9 (N/A Abdomen)  Patient location during evaluation: Mother Baby Anesthesia Type: Spinal Level of consciousness: oriented and awake and alert Pain management: pain level controlled Respiratory status: spontaneous breathing and respiratory function stable Cardiovascular status: blood pressure returned to baseline and stable Postop Assessment: no headache, no backache, no apparent nausea or vomiting and able to ambulate Anesthetic complications: no     Last Vitals:  Vitals:   05/20/19 2354 05/21/19 0749  BP: (!) 141/83 133/78  Pulse: 75 77  Resp: 18 20  Temp: 36.4 C 36.9 C  SpO2: 100% 98%    Last Pain:  Vitals:   05/21/19 0749  TempSrc: Oral  PainSc:                  Durenda Hurt

## 2019-05-21 NOTE — Progress Notes (Signed)
POD#2 rLTCS Subjective:  Appears fatigued, NAD. Concerned about breastfeeding, discussed pumping and feeding additional colostrum after baby has fed. Area on chin had purulent drainage last night and is now covered with gauze and tape, is no longer draining per patient. Voiding and ambulating without difficulty. Tolerating a regular diet.  Objective:  Blood pressure 133/78, pulse 77, temperature 98.4 F (36.9 C), temperature source Oral, resp. rate 20, height 5\' 4"  (1.626 m), weight 104.3 kg, last menstrual period 08/21/2018, SpO2 98 %, unknown if currently breastfeeding.  General: NAD Pulmonary: no increased work of breathing Abdomen: non-distended, non-tender, fundus firm, lochia appropriate Incision: Dressing C/D/I; On-Q in place Extremities: no signs of DVT  Results for orders placed or performed during the hospital encounter of 05/19/19 (from the past 72 hour(s))  CBC     Status: Abnormal   Collection Time: 05/19/19  7:01 PM  Result Value Ref Range   WBC 15.1 (H) 4.0 - 10.5 K/uL   RBC 5.48 (H) 3.87 - 5.11 MIL/uL   Hemoglobin 15.8 (H) 12.0 - 15.0 g/dL   HCT 16.146.6 (H) 09.636.0 - 04.546.0 %   MCV 85.0 80.0 - 100.0 fL   MCH 28.8 26.0 - 34.0 pg   MCHC 33.9 30.0 - 36.0 g/dL   RDW 40.913.7 81.111.5 - 91.415.5 %   Platelets 169 150 - 400 K/uL   nRBC 0.0 0.0 - 0.2 %    Comment: Performed at [facility], [address] [place], [place] [address]  Comprehensive metabolic panel     Status: Abnormal   Collection Time: 05/19/19  7:01 PM  Result Value Ref Range   Sodium 135 135 - 145 mmol/L   Potassium 3.4 (L) 3.5 - 5.1 mmol/L   Chloride 103 98 - 111 mmol/L   CO2 20 (L) 22 - 32 mmol/L   Glucose, Bld 85 70 - 99 mg/dL   BUN 6 6 - 20 mg/dL   Creatinine, Ser 6.210.61 0.44 - 1.00 mg/dL   Calcium 8.7 (L) 8.9 - 10.3 mg/dL   Total Protein 7.0 6.5 - 8.1 g/dL   Albumin 2.9 (L) 3.5 - 5.0 g/dL   AST 30 15 - 41 U/L   ALT 22 0 - 44 U/L   Alkaline Phosphatase 138 (H) 38 - 126 U/L   Total Bilirubin 0.8 0.3 -  1.2 mg/dL   GFR calc non Af Amer >60 >60 mL/min   GFR calc Af Amer >60 >60 mL/min   Anion gap 12 5 - 15    Comment: Performed at [facility], [address] [place], [place] [address]  Protein / creatinine ratio, urine     Status: Abnormal   Collection Time: 05/19/19  7:12 PM  Result Value Ref Range   Creatinine, Urine 80 mg/dL   Total Protein, Urine 22 mg/dL    Comment: NO NORMAL RANGE ESTABLISHED FOR THIS TEST   Protein Creatinine Ratio 0.28 (H) 0.00 - 0.15 mg/mg[Cre]    Comment: Performed at [facility], [address] [place], [place] [address]  Type and screen [facility]     Status: None   Collection Time: 05/19/19  7:13 PM  Result Value Ref Range   ABO/RH(D) O NEG    Antibody Screen POS    Sample Expiration 05/22/2019,2359    Antibody Identification PASSIVELY ACQUIRED ANTI-D    Unit Number [id number]    Blood Component Type RBC LR PHER2    Unit division 00    Status of Unit REL  FROM [facility]    Transfusion Status OK TO TRANSFUSE    Crossmatch Result COMPATIBLE    Unit Number [id number]    Blood Component Type RED CELLS,LR    Unit division 00    Status of Unit REL FROM [facility]    Transfusion Status OK TO TRANSFUSE    Crossmatch Result COMPATIBLE   CBC     Status: Abnormal   Collection Time: 05/20/19  4:28 AM  Result Value Ref Range   WBC 11.8 (H) 4.0 - 10.5 K/uL   RBC 4.58 3.87 - 5.11 MIL/uL   Hemoglobin 13.3 12.0 - 15.0 g/dL   HCT 38.7 36.0 - 46.0 %   MCV 84.5 80.0 - 100.0 fL   MCH 29.0 26.0 - 34.0 pg   MCHC 34.4 30.0 - 36.0 g/dL   RDW 13.4 11.5 - 15.5 %   Platelets 145 (L) 150 - 400 K/uL   nRBC 0.0 0.0 - 0.2 %    Comment: Performed at [facility], [address] [place], [place] [address]  ABO/Rh     Status: None   Collection Time: 05/20/19  4:28 AM  Result Value Ref Range   ABO/RH(D)      Jenetta Downer NEG Performed at [facility], [address] [place], [place] [address]   Fetal screen      Status: None   Collection Time: 05/20/19  4:28 AM  Result Value Ref Range   Fetal Screen      NEG Performed at [facility], [address] [place], [place] [address]   Rhogam injection     Status: None   Collection Time: 05/20/19  4:28 AM  Result Value Ref Range   Unit Number [id number]    Blood Component Type RHIG    Unit division 00    Status of Unit ISSUED,FINAL    Transfusion Status      OK TO TRANSFUSE Performed at [facility], [address] [place], [place] [address]      Assessment:   38 y.o. [phone] postoperative day #2, recovering well   Plan:  1) Acute blood loss anemia - hemodynamically stable and asymptomatic - PO ferrous sulfate  2) Blood Type --/--/O NEG Performed at [facility], [address] [place],  [address]  [id number] [phone]) / Charlynn Grimes Immune (01/28 0000) / Varicella immune  RHIG given  3) TDAP status: received antepartum 11/24/2018  4) Breastfeeding/Contraception: vasectomy  5) Continue IV vancomycin for facial cellulitis/wound  6) Disposition: continue postoperative care, may consider late discharge in the evening if breastfeeding is going well.  Avel Sensor, CNM 05/21/2019  10:50 AM

## 2019-05-22 ENCOUNTER — Encounter: Payer: Self-pay | Admitting: Obstetrics and Gynecology

## 2019-05-22 ENCOUNTER — Inpatient Hospital Stay: Payer: Medicaid Other

## 2019-05-22 DIAGNOSIS — B9562 Methicillin resistant Staphylococcus aureus infection as the cause of diseases classified elsewhere: Secondary | ICD-10-CM

## 2019-05-22 DIAGNOSIS — Z886 Allergy status to analgesic agent status: Secondary | ICD-10-CM

## 2019-05-22 DIAGNOSIS — F17210 Nicotine dependence, cigarettes, uncomplicated: Secondary | ICD-10-CM

## 2019-05-22 DIAGNOSIS — Z8614 Personal history of Methicillin resistant Staphylococcus aureus infection: Secondary | ICD-10-CM

## 2019-05-22 DIAGNOSIS — L089 Local infection of the skin and subcutaneous tissue, unspecified: Secondary | ICD-10-CM

## 2019-05-22 DIAGNOSIS — Z888 Allergy status to other drugs, medicaments and biological substances status: Secondary | ICD-10-CM

## 2019-05-22 DIAGNOSIS — Z8619 Personal history of other infectious and parasitic diseases: Secondary | ICD-10-CM

## 2019-05-22 DIAGNOSIS — Z885 Allergy status to narcotic agent status: Secondary | ICD-10-CM

## 2019-05-22 DIAGNOSIS — Z881 Allergy status to other antibiotic agents status: Secondary | ICD-10-CM

## 2019-05-22 DIAGNOSIS — L739 Follicular disorder, unspecified: Secondary | ICD-10-CM

## 2019-05-22 MED ORDER — IOHEXOL 300 MG/ML  SOLN
75.0000 mL | Freq: Once | INTRAMUSCULAR | Status: AC | PRN
  Administered 2019-05-22: 16:00:00 75 mL via INTRAVENOUS

## 2019-05-22 MED ORDER — OXYCODONE HCL 5 MG PO TABS: 5.0000 mg | ORAL_TABLET | Freq: Four times a day (QID) | ORAL | 0 refills | Status: AC | PRN

## 2019-05-22 MED ORDER — MUPIROCIN 2 % EX OINT: TOPICAL_OINTMENT | Freq: Two times a day (BID) | CUTANEOUS | 0 refills | Status: DC

## 2019-05-22 MED ORDER — NORETHINDRONE 0.35 MG PO TABS: 1.0000 | ORAL_TABLET | Freq: Every day | ORAL | 4 refills | Status: DC

## 2019-05-22 MED ORDER — CLINDAMYCIN HCL 300 MG PO CAPS: 300.0000 mg | ORAL_CAPSULE | Freq: Four times a day (QID) | ORAL | 0 refills | Status: DC

## 2019-05-22 NOTE — Lactation Note (Signed)
This note was copied from a baby's chart. Lactation Consultation Note  Patient Name: Stacey Dunn Today's Date: 05/22/2019 Reason for consult: Follow-up assessment   Maternal Data    Feeding Feeding Type: Breast Milk   Interventions  Mom is feeling confident in her ability to breastfeed.  Baby latches better on her right side than her left.  Mom reports feeling some pinching on her left side and says her nipple is compressed after feeding on that side.  Mom reviewed sandwiching techniques with the [facility] intern, voicing that [name] told her how to get more breast tissue in baby's mouth.  Baby had just nursed on right in side-lying and fell asleep.  Mom was told when her milk may transition, how to relieve engorgement, just to pump for comfort as needed, joining our breastfeeding support group, and calling for outpatient care.  Mom has been told to call at the next feeding to assess latch on the more painful side.  Lactation Tools Discussed/Used     Consult Status Consult Status: Complete Date: 05/22/19    Lavonia Drafts 05/22/2019, 11:04 AM

## 2019-05-22 NOTE — Consult Note (Signed)
NAME: Stacey Dunn  DOB: Mar 25, 1981  MRN: [id number]  Date/Time: 05/22/2019 6:52 PM  REQUESTING PROVIDER: Dr. Jerene PitchSchuman  Subjective:  REASON FOR CONSULT: MRSA infection ? Stacey Dunn is a 38 y.o. female, previous C-section G2, P1 was admitted on 05/19/2027 with contractions.  She underwent C-section the day.  Incidentally was noted infected follicular lesion on the right side of her chin with erythema around.  Patient had shaved the facial hair couple of days ago.  She had already noticed some folliculitis and apply Neosporin.  But 1 lesion on the chin got bigger and started to cause pain and swelling.  She was started on IV vancomycin by the obstetrician.  The lesion formed a crater and then started to discharge yesterday and cultures were sent. I am asked to see the patient for antibiotic recommendation.. 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  She says one time she had staph aureus in the right ankle area and was I indeed.  She then had a lesion on her left shoulder around the scapular area and that was MRSA.    Past Medical History:  Diagnosis Date  . 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 Bipolar 2 disorder (HCC)    no meds currently  . Caries   . Constipation   . COPD (chronic obstructive pulmonary disease) (HCC)   . Depression    History -no meds  . History of borderline personality disorder    no meds currently  . History of chest pain    Per client, eval at [facility] ED 2017 - 2018. "Nodules right lung."  No follow-up by client.  . Hypertension    History "PIH"  . Migraine    History  . Nausea & vomiting    associated with pregnancy  . Varicosities of leg   . Wears partial dentures    Upper    Past Surgical History:  Procedure Laterality Date  . ANKLE SURGERY Right    For Staph infection  . CESAREAN SECTION    . CESAREAN SECTION N/A 05/19/2019   Procedure: CESAREAN SECTION- Repeat SEX: FEMALE TOB 2303  weight 9lbs 0oz apgars: 391min:9, 685min:9;  Surgeon: Natale MilchSchuman, Christanna R, MD;  Location: [facility] ORS;  Service: Obstetrics;  Laterality: N/A;    Social History   Socioeconomic History  . Marital status: Single    Spouse name: Stacey Dunn  . Number of children: 1  . Years of education: 210  . Highest education level: Not on file  Occupational History  . Not on file  Social Needs  . Financial resource strain: Not on file  . Food insecurity    Worry: Not on file    Inability: Not on file  . Transportation needs    Medical: Not on file    Non-medical: Not on file  Tobacco Use  . Smoking status: Current Every Day Smoker    Packs/day: 0.25    Years: 21.00    Pack years: 5.25    Types: Cigarettes  . Smokeless tobacco: Never Used  Substance and Sexual Activity  . Alcohol use: Not Currently  . Drug use: Yes    Types: Marijuana  . Sexual activity: Yes    Birth control/protection: Pill  Lifestyle  . Physical activity    Days per week: Not on file    Minutes per session: Not on file  . Stress: Not on file  Relationships  .  Social Musician on phone: Not on file    Gets together: Not on file    Attends religious service: Not on file    Active member of club or organization: Not on file    Attends meetings of clubs or organizations: Not on file    Relationship status: Not on file  . Intimate partner violence    Fear of current or ex partner: Not on file    Emotionally abused: Not on file    Physically abused: Not on file    Forced sexual activity: Not on file  Other Topics Concern  . Not on file  Social History Narrative  . Not on file    Family History  Problem Relation Age of Onset  . Kidney disease Mother   . Renal Disease Mother   . Hypertension Mother   . Alcohol abuse Father   . Hypertension Sister   . Cancer Sister   . Heart Problems Sister   . Cancer Maternal Uncle   . Cancer Maternal Grandfather   . Cancer Paternal Grandfather   . Diabetes  Paternal Grandfather   . Epilepsy Sister   . Migraines Sister    Allergies  Allergen Reactions  . Meloxicam Swelling  . Morphine And Related   . Penicillins     Hives Did it involve swelling of the face/tongue/throat, SOB, or low BP? No Did it involve sudden or severe rash/hives, skin peeling, or any reaction on the inside of your mouth or nose? Yes Did you need to seek medical attention at a hospital or doctor's office? Yes When did it last happen?childhood  If all above answers are "NO", may proceed with cephalosporin use.   Marland Kitchen Morphine Hives, Other (See Comments) and Rash    hallucinations   . Sulfa Antibiotics Hives, Other (See Comments) and Rash    unknown     ? Current Facility-Administered Medications  Medication Dose Route Frequency Provider Last Rate Last Dose  . 0.9 %  sodium chloride infusion   Intravenous PRN Oswaldo Conroy, CNM 10 mL/hr at 05/22/19 1752    . acetaminophen (TYLENOL) tablet 1,000 mg  1,000 mg Oral Q6H Vena Austria, MD   1,000 mg at 05/22/19 1344  . bisacodyl (DULCOLAX) suppository 10 mg  10 mg Rectal Daily PRN Schuman, Christanna R, MD      . bupivacaine ON-Q pain pump   Other Continuous Schuman, Christanna R, MD      . coconut oil  1 application Topical PRN Natale Milch, MD   1 application at 05/21/19 0851  . diphenhydrAMINE (BENADRYL) capsule 25 mg  25 mg Oral Q6H PRN Schuman, Christanna R, MD      . ferrous sulfate tablet 325 mg  325 mg Oral BID WC Schuman, Christanna R, MD   325 mg at 05/22/19 1741  . HYDROmorphone (DILAUDID) injection 0.2-0.6 mg  0.2-0.6 mg Intravenous Q2H PRN Schuman, Christanna R, MD      . ibuprofen (ADVIL) tablet 800 mg  800 mg Oral Q8H Oswaldo Conroy, CNM   800 mg at 05/22/19 1741  . lactated ringers infusion   Intravenous Continuous Schuman, Christanna R, MD      . menthol-cetylpyridinium (CEPACOL) lozenge 3 mg  1 lozenge Oral Q2H PRN Schuman, Christanna R, MD      . mupirocin ointment (BACTROBAN) 2  %   Topical BID Oswaldo Conroy, CNM      . oxyCODONE (Oxy IR/ROXICODONE) immediate release tablet 5-10  mg  5-10 mg Oral Q4H PRN Schuman, Christanna R, MD   10 mg at 05/22/19 1742  . prenatal multivitamin tablet 1 tablet  1 tablet Oral Q1200 Schuman, Christanna R, MD   1 tablet at 05/22/19 1344  . senna-docusate (Senokot-S) tablet 2 tablet  2 tablet Oral Q24H Natale Milch, MD   2 tablet at 05/22/19 0928  . simethicone (MYLICON) chewable tablet 80 mg  80 mg Oral TID PC Schuman, Christanna R, MD   80 mg at 05/22/19 1741  . simethicone (MYLICON) chewable tablet 80 mg  80 mg Oral Q24H Schuman, Christanna R, MD      . simethicone (MYLICON) chewable tablet 80 mg  80 mg Oral PRN Schuman, Christanna R, MD      . sodium phosphate (FLEET) 7-19 GM/118ML enema 1 enema  1 enema Rectal Daily PRN Schuman, Christanna R, MD      . vancomycin (VANCOCIN) IVPB 1000 mg/200 mL premix  1,000 mg Intravenous Q12H Oswaldo Conroy, CNM 200 mL/hr at 05/22/19 1753 1,000 mg at 05/22/19 1753  . zolpidem (AMBIEN) tablet 5 mg  5 mg Oral QHS PRN Schuman, Christanna R, MD         Abtx:  Anti-infectives (From admission, onward)   Start     Dose/Rate Route Frequency Ordered Stop   05/22/19 0000  clindamycin (CLEOCIN) 300 MG capsule     300 mg Oral 4 times daily 05/22/19 0931 05/29/19 2359   05/20/19 1600  vancomycin (VANCOCIN) IVPB 1000 mg/200 mL premix     1,000 mg 200 mL/hr over 60 Minutes Intravenous Every 12 hours 05/20/19 1557     05/20/19 0900  vancomycin (VANCOCIN) IVPB 1000 mg/200 mL premix  Status:  Discontinued     1,000 mg 200 mL/hr over 60 Minutes Intravenous Every 12 hours 05/19/19 2221 05/20/19 0025   05/19/19 2137  clindamycin (CLEOCIN) IVPB 900 mg     900 mg 100 mL/hr over 30 Minutes Intravenous 60 min pre-op 05/19/19 2137 05/19/19 2247   05/19/19 2137  gentamicin (GARAMYCIN) 520 mg in dextrose 5 % 100 mL IVPB     5 mg/kg  104.3 kg 113 mL/hr over 60 Minutes Intravenous 60 min pre-op 05/19/19  2137 05/19/19 2359   05/19/19 2100  vancomycin (VANCOCIN) 2,000 mg in sodium chloride 0.9 % 500 mL IVPB     2,000 mg 250 mL/hr over 120 Minutes Intravenous  Once 05/19/19 2021 05/19/19 2242   05/19/19 2015  vancomycin (VANCOCIN) IVPB 1000 mg/200 mL premix  Status:  Discontinued     1,000 mg 200 mL/hr over 60 Minutes Intravenous  Once 05/19/19 2005 05/19/19 2021      REVIEW OF SYSTEMS:  Const: Felt hot , negative chills, negative weight loss Eyes: negative diplopia or visual changes, negative eye pain ENT: negative coryza, negative sore throat Resp: negative cough, hemoptysis, dyspnea Cards: negative for chest pain, palpitations, lower extremity edema GU: negative for frequency, dysuria and hematuria GI: Negative for abdominal pain, diarrhea, bleeding, constipation Skin: As above Heme: negative for easy bruising and gum/nose bleeding MS: negative for myalgias, arthralgias, back pain and muscle weakness Neurolo:negative for headaches, dizziness, vertigo, memory problems  Psych: negative for feelings of anxiety, depression  Endocrine: No polyuria polydipsia Allergy/Immunology-has sulfa and penicillin allergy.  Says she can tolerate amoxicillin Objective:  VITALS:  BP 130/85 (BP Location: Left Arm)   Pulse 87   Temp 98.3 F (36.8 C) (Oral)   Resp 18   Ht 5\' 4"  (1.626 m)  Wt 101.3 kg   LMP 08/21/2018 (Approximate)   SpO2 100%   Breastfeeding Unknown   BMI 38.33 kg/m  PHYSICAL EXAM:  General: Alert, cooperative, no distress, appears stated age.  Head: Normocephalic, without obvious abnormality, atraumatic. Eyes: Conjunctivae clear, anicteric sclerae. Pupils are equal ENT Nares normal. No drainage or sinus tenderness. Lips, mucosa, and tongue normal. No Thrush poor dentition. Neck: Under the chin there is a 1.5 cm nodular lesion with central crater.  Surrounding induration.  Some erythema.    Back: No CVA tenderness. Lungs: Clear to auscultation bilaterally. No Wheezing or  Rhonchi. No rales. Heart: Regular rate and rhythm, no murmur, rub or gallop. Abdomen: Soft,  distended.  Surgical site covered with dressing.  Has a catheter glucose analysis.   Extremities: atraumatic, no cyanosis. No edema. No clubbing Skin: No rashes or lesions. Or bruising Lymph: Cervical, supraclavicular normal. Neurologic: Grossly non-focal Pertinent Labs Lab Results CBC    Component Value Date/Time   WBC 11.8 (H) 05/20/2019 0428   RBC 4.58 05/20/2019 0428   HGB 13.3 05/20/2019 0428   HGB 13.3 03/08/2019   HCT 38.7 05/20/2019 0428   HCT 44 (A) 10/18/2018   PLT 145 (L) 05/20/2019 0428   PLT 285,000 10/18/2018   MCV 84.5 05/20/2019 0428   MCH 29.0 05/20/2019 0428   MCHC 34.4 05/20/2019 0428   RDW 13.4 05/20/2019 0428    CMP Latest Ref Rng & Units 05/19/2019  Glucose 70 - 99 mg/dL 85  BUN 6 - 20 mg/dL 6  Creatinine 0.44 - 1.00 mg/dL 0.61  Sodium 135 - 145 mmol/L 135  Potassium 3.5 - 5.1 mmol/L 3.4(L)  Chloride 98 - 111 mmol/L 103  CO2 22 - 32 mmol/L 20(L)  Calcium 8.9 - 10.3 mg/dL 8.7(L)  Total Protein 6.5 - 8.1 g/dL 7.0  Total Bilirubin 0.3 - 1.2 mg/dL 0.8  Alkaline Phos 38 - 126 U/L 138(H)  AST 15 - 41 U/L 30  ALT 0 - 44 U/L 22      Microbiology: Recent Results (from the past 240 hour(s))  SARS CORONAVIRUS 2 (TAT 6-12 HRS) Nasal Swab Aptima Multi Swab     Status: None   Collection Time: 05/19/19  9:36 AM   Specimen: Aptima Multi Swab; Nasal Swab  Result Value Ref Range Status   SARS Coronavirus 2 NEGATIVE NEGATIVE Final    Comment: (NOTE) SARS-CoV-2 target nucleic acids are NOT DETECTED. The SARS-CoV-2 RNA is generally detectable in upper and lower respiratory specimens during the acute phase of infection. Negative results do not preclude SARS-CoV-2 infection, do not rule out co-infections with other pathogens, and should not be used as the sole basis for treatment or other patient management decisions. Negative results must be combined with clinical  observations, patient history, and epidemiological information. The expected result is Negative. Fact Sheet for Patients: SugarRoll.be Fact Sheet for Healthcare Providers: https://www.woods-mathews.com/ This test is not yet approved or cleared by the [place] FDA and  has been authorized for detection and/or diagnosis of SARS-CoV-2 by FDA under an Emergency Use Authorization (EUA). This EUA will remain  in effect (meaning this test can be used) for the duration of the COVID-19 declaration under Section 56 4(b)(1) of the Act, 21 U.S.C. section 360bbb-3(b)(1), unless the authorization is terminated or revoked sooner. Performed at [facility], [address], [place], [place] [address]   Aerobic/Anaerobic Culture (surgical/deep wound)     Status: None (Preliminary result)   Collection Time: 05/21/19  6:10 PM  Specimen: Face; Wound  Result Value Ref Range Status   Specimen Description   Final    FACE Performed at [facility], [address] [place], [place] [address]    Special Requests   Final    NONE Performed at [facility], [address] [place], [place] [address]    Gram Stain   Final    NO WBC SEEN RARE GRAM POSITIVE COCCI Performed at [facility], [address] [place], [place] [address]    Culture FEW STAPHYLOCOCCUS AUREUS  Final   Report Status PENDING  Incomplete    IMAGING RESULTS: CT of the neck showed nonspecific cellulitic pattern of the chin and submandibular region.  No evidence of drainable abscess. I have personally reviewed the films ? Impression/Recommendation ? 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  Skin and soft tissue infection under the chin.  Created in the center.  Culture has been sent and preliminary staph aureus.  Awaiting final ID.  Currently on vancomycin.  Depending on the ID we can decide  whether she could go on a p.o. antibiotic.  She is allergic to sulfa.  She has tried clindamycin in the past.  We will check to see whether linezolid is an option especially with her breast-feeding. Doxycycline will not be a option as she is breast-feeding.  Patient is status post C-section -full-term delivery.  ?  Need to know her HIV status.  [facility].  Need records ___________________________________________________ Discussed with patient and  requesting provider Note:  This document was prepared using Dragon voice recognition software and may include unintentional dictation errors.

## 2019-05-22 NOTE — Progress Notes (Signed)
Pt taken for CT scan 

## 2019-05-22 NOTE — Consult Note (Signed)
Stacey Dunn, Stacey Dunn [id number] 09/11/1981 Stacey Nearing, MD  Reason for Consult: facial swelling Requesting Physician: Stacey Fellers, MD Consulting Physician: Stacey Nearing, MD  HPI: This 38 y.o. year old female was admitted on 05/19/2019 for 39 weeks preg contractions. Patient presented in labor and has a history of skin cellulitis and abscesses with prior MRSA. She had developed swelling right lower face near jaw where she admits she shaves due to facial hair. She was started empirically on Vanc and she reports significant improvement. There is an area where the presumed infected hair follicle scabbed over and has drained from that area. She denies any significant pain at this point, saying that is much improved. Also had a swollen lymph node under her right ear but that is better as well. CT showed no abscess.    Medications:  Current Facility-Administered Medications  Medication Dose Route Frequency Provider Last Rate Last Dose  . 0.9 %  sodium chloride infusion   Intravenous PRN Rexene Agent, CNM 10 mL/hr at 05/22/19 2878    . acetaminophen (TYLENOL) tablet 1,000 mg  1,000 mg Oral Q6H Stacey Mood, MD   1,000 mg at 05/22/19 1344  . bisacodyl (DULCOLAX) suppository 10 mg  10 mg Rectal Daily PRN Dunn, Stacey R, MD      . bupivacaine ON-Q pain pump   Other Continuous Dunn, Stacey R, MD      . coconut oil  1 application Topical PRN Stacey Fellers, MD   1 application at 67/67/20 0851  . diphenhydrAMINE (BENADRYL) capsule 25 mg  25 mg Oral Q6H PRN Dunn, Stacey R, MD      . ferrous sulfate tablet 325 mg  325 mg Oral BID WC Dunn, Stacey R, MD   325 mg at 05/22/19 0929  . HYDROmorphone (DILAUDID) injection 0.2-0.6 mg  0.2-0.6 mg Intravenous Q2H PRN Dunn, Stacey R, MD      . ibuprofen (ADVIL) tablet 800 mg  800 mg Oral Q8H Rexene Agent, CNM   800 mg at 05/22/19 9470  . lactated ringers infusion   Intravenous Continuous Dunn,  Stacey R, MD      . menthol-cetylpyridinium (CEPACOL) lozenge 3 mg  1 lozenge Oral Q2H PRN Dunn, Stacey R, MD      . mupirocin ointment (BACTROBAN) 2 %   Topical BID Rexene Agent, CNM      . oxyCODONE (Oxy IR/ROXICODONE) immediate release tablet 5-10 mg  5-10 mg Oral Q4H PRN Dunn, Stacey R, MD   10 mg at 05/22/19 1344  . prenatal multivitamin tablet 1 tablet  1 tablet Oral Q1200 Dunn, Stacey R, MD   1 tablet at 05/22/19 1344  . senna-docusate (Senokot-S) tablet 2 tablet  2 tablet Oral Q24H Stacey Fellers, MD   2 tablet at 05/22/19 0928  . simethicone (MYLICON) chewable tablet 80 mg  80 mg Oral TID PC Dunn, Stacey R, MD   80 mg at 05/22/19 1344  . simethicone (MYLICON) chewable tablet 80 mg  80 mg Oral Q24H Dunn, Stacey R, MD      . simethicone (MYLICON) chewable tablet 80 mg  80 mg Oral PRN Dunn, Stacey R, MD      . sodium phosphate (FLEET) 7-19 GM/118ML enema 1 enema  1 enema Rectal Daily PRN Dunn, Stacey R, MD      . vancomycin (VANCOCIN) IVPB 1000 mg/200 mL premix  1,000 mg Intravenous Q12H Rexene Agent, CNM 200 mL/hr at 05/22/19 0625 1,000 mg at 05/22/19 9628  .  zolpidem (AMBIEN) tablet 5 mg  5 mg Oral QHS PRN Dunn, Stacey R, MD      .  Medications Prior to Admission  Medication Sig Dispense Refill  . Prenatal Vit-Fe Fumarate-FA (PRENATAL MULTIVITAMIN) TABS tablet Take 1 tablet by mouth daily.     Marland Kitchen aspirin EC 81 MG tablet Take 81 mg by mouth daily.      Allergies:  Allergies  Allergen Reactions  . Meloxicam Swelling  . Morphine And Related   . Penicillins     Hives Did it involve swelling of the face/tongue/throat, SOB, or low BP? No Did it involve sudden or severe rash/hives, skin peeling, or any reaction on the inside of your mouth or nose? Yes Did you need to seek medical attention at a hospital or doctor's office? Yes When did it last happen?childhood  If all above answers are "NO", may proceed  with cephalosporin use.   Marland Kitchen Morphine Hives, Other (See Comments) and Rash    hallucinations   . Sulfa Antibiotics Hives, Other (See Comments) and Rash    unknown     PMH:  Past Medical History:  Diagnosis Date  . 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 Bipolar 2 disorder (HCC)    no meds currently  . Caries   . Constipation   . COPD (chronic obstructive pulmonary disease) (HCC)   . Depression    History -no meds  . History of borderline personality disorder    no meds currently  . History of chest pain    Per client, eval at [facility] ED 2017 - 2018. "Nodules right lung."  No follow-up by client.  . Hypertension    History "PIH"  . Migraine    History  . Nausea & vomiting    associated with pregnancy  . Varicosities of leg   . Wears partial dentures    Upper    Fam Hx:  Family History  Problem Relation Age of Onset  . Kidney disease Mother   . Renal Disease Mother   . Hypertension Mother   . Alcohol abuse Father   . Hypertension Sister   . Cancer Sister   . Heart Problems Sister   . Cancer Maternal Uncle   . Cancer Maternal Grandfather   . Cancer Paternal Grandfather   . Diabetes Paternal Grandfather   . Epilepsy Sister   . Migraines Sister     Soc Hx:  Social History   Socioeconomic History  . Marital status: Single    Spouse name: Stacey Dunn  . Number of children: 1  . Years of education: 67  . Highest education level: Not on file  Occupational History  . Not on file  Social Needs  . Financial resource strain: Not on file  . Food insecurity    Worry: Not on file    Inability: Not on file  . Transportation needs    Medical: Not on file    Non-medical: Not on file  Tobacco Use  . Smoking status: Current Every Day Smoker    Packs/day: 0.25    Years: 21.00    Pack years: 5.25    Types: Cigarettes  . Smokeless tobacco: Never Used  Substance and Sexual Activity  . Alcohol use: Not  Currently  . Drug use: Yes    Types: Marijuana  . Sexual activity: Yes    Birth control/protection: Pill  Lifestyle  . Physical activity  Days per week: Not on file    Minutes per session: Not on file  . Stress: Not on file  Relationships  . Social Musicianconnections    Talks on phone: Not on file    Gets together: Not on file    Attends religious service: Not on file    Active member of club or organization: Not on file    Attends meetings of clubs or organizations: Not on file    Relationship status: Not on file  . Intimate partner violence    Fear of current or ex partner: Not on file    Emotionally abused: Not on file    Physically abused: Not on file    Forced sexual activity: Not on file  Other Topics Concern  . Not on file  Social History Narrative  . Not on file    PSH:  Past Surgical History:  Procedure Laterality Date  . ANKLE SURGERY Right    For Staph infection  . CESAREAN SECTION    . CESAREAN SECTION N/A 05/19/2019   Procedure: CESAREAN SECTION- Repeat SEX: FEMALE TOB 2303 weight 9lbs 0oz apgars: 201min:9, 575min:9;  Surgeon: Natale MilchSchuman, Stacey R, MD;  Location: [facility] ORS;  Service: Obstetrics;  Laterality: N/A;  . Procedures since admission: No admission procedures for hospital encounter.  ROS: Review of systems normal other than 12 systems except per HPI.  PHYSICAL EXAM  Vitals: Blood pressure 130/85, pulse 87, temperature 98.3 F (36.8 C), temperature source Oral, resp. rate 18, height 5\' 4"  (1.626 m), weight 101.3 kg, last menstrual period 08/21/2018, SpO2 100 %, unknown if currently breastfeeding.. General: Well-developed, Well-nourished in no acute distress Dunn: Dunn and affect well adjusted, pleasant and cooperative. Orientation: Grossly alert and oriented. Vocal Quality: No hoarseness. Communicates verbally. head and Face: NCAT. Swelling left lower face around jawline with induration, mild erythema, no significant tenderness. Small 8mm area of skin  breakdown where area drained, but no gross purulence expressible  Ears: External ears with normal landmarks, no lesions. Otoscope not available, but middle ears are noted clear on CT Hearing: Speech reception grossly normal. Nose: External nose normal with midline dorsum and no lesions or deformity. Nasal Cavity reveals essentially midline septum with normal inferior turbinates. No significant mucosal congestion or erythema. Nasal secretions are minimal and clear. No polyps seen on anterior rhinoscopy. Oral Cavity/ Oropharynx: Lips are normal with no lesions. Teeth no frank dental caries. Gingiva healthy with no lesions or gingivitis. Oropharynx including tongue, buccal mucosa, floor of mouth, hard and soft palate, uvula and posterior pharynx free of exudates, erythema or lesions with normal symmetry and hydration.  Indirect Laryngoscopy/Nasopharyngoscopy: Visualization of the larynx, hypopharynx and nasopharynx is not possible in this setting with routine examination. Neck: Induration as noted above with submental swelling, no fluctuance.  Thyroid gland is soft, nontender and symmetric with no masses or enlargement. Parotid and left submandibular glands are soft, nontender and symmetric, without masses. Right submandibular not palpable due to overlying skin induration Lymphatic: Cervical lymph nodes are without palpable lymphadenopathy or tenderness other than the submental region Respiratory: Normal respiratory effort without labored breathing. Cardiovascular: Carotid pulse shows regular rate and rhythm Neurologic: Cranial Nerves II through XII are grossly intact. Eyes: Gaze and Ocular Motility are grossly normal. PERRLA. No visible nystagmus.  MEDICAL DECISION MAKING: Data Review:  Results for orders placed or performed during the hospital encounter of 05/19/19 (from the past 48 hour(s))  Aerobic/Anaerobic Culture (surgical/deep wound)     Status: None (Preliminary result)  Collection Time:  05/21/19  6:10 PM   Specimen: Face; Wound  Result Value Ref Range   Specimen Description      FACE Performed at [facility], [address] [place], [place] [address]    Special Requests      NONE Performed at [facility], [address] [place], [place] [address]    Gram Stain      NO WBC SEEN RARE GRAM POSITIVE COCCI Performed at [facility], [address] [place], [place] [address]    Culture FEW STAPHYLOCOCCUS AUREUS    Report Status PENDING   . Ct Soft Tissue Neck W Contrast  Result Date: 05/22/2019 CLINICAL DATA:  Soft tissue swelling of the chin. History MRSA infection. EXAM: CT NECK WITH CONTRAST TECHNIQUE: Multidetector CT imaging of the neck was performed using the standard protocol following the bolus administration of intravenous contrast. CONTRAST:  75mL OMNIPAQUE IOHEXOL 300 MG/ML  SOLN COMPARISON:  None. FINDINGS: Pharynx and larynx: No mucosal or submucosal lesion is seen. Salivary glands: Parotid and submandibular glands are normal. Thyroid: Normal Lymph nodes: Slight reactive prominence submandibular nodes the right of midline. No advanced adenopathy or low-density nodes. Vascular: Normal Limited intracranial: Normal Visualized orbits: Normal Mastoids and visualized paranasal sinuses: Clear Skeleton: Mild midcervical spondylosis. Upper chest: Normal Other: Nonspecific soft tissue swelling the chin and submandibular soft tissues more to the right of midline. Skin defect evident on the right side. The underlying fat shows inflammatory edema but without evidence of a drainable abscess or any communication with the deep structures. IMPRESSION: Nonspecific cellulitis pattern of the chin and submandibular region on the right. Skin defect visible clinically is also visible by CT. No evidence of drainable abscess. Mild reactive nodal prominence of the submandibular lymph nodes on the right. Electronically Signed   By: Paulina FusiMark  Shogry M.D.   On: 05/22/2019  16:32  .   ASSESSMENT: Facial/neck cellulitis  PLAN: No need for I&D as there is no abscess on scan either in soft tissues or nodes, just swelling, cellulitis and reactive nodes. Agree with Vancomycin. Sensitivities are pending on culture. Doxycycline often provides good coverage for MRSA, so that would be the PO drug of choice since she is allergic to sulfa, but defer to ID on decision whether to do a PICC line to continue Vanc at home or wait for sensitivities. Continue Bactroban to wound. Will sign off since no surgical intervention is needed.   Sandi MealyPaul S Quintrell Baze, MD 05/22/2019 5:28 PM

## 2019-05-22 NOTE — Progress Notes (Addendum)
Continued induration of chin with now crater of purulent exudate.  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  Contacted ID and ENT regarding consultation.  Continue IV antibiotics CT of neck with contrast ordered.   Adrian Prows MD [facility] 05/22/2019 1:27 PM

## 2019-05-22 NOTE — Lactation Note (Signed)
This note was copied from a baby's chart. Lactation Consultation Note  Patient Name: Stacey Dunn Today's Date: 05/22/2019 Reason for consult: Follow-up assessment   Maternal Data    Feeding Feeding Type: Breast Fed  LATCH Score Latch: Repeated attempts needed to sustain latch, nipple held in mouth throughout feeding, stimulation needed to elicit sucking reflex.  Audible Swallowing: A few with stimulation  Type of Nipple: Everted at rest and after stimulation  Comfort (Breast/Nipple): Filling, red/small blisters or bruises, mild/mod discomfort  Hold (Positioning): Assistance needed to correctly position infant at breast and maintain latch.  LATCH Score: 6  Interventions Interventions: Assisted with latch  Lactation Tools Discussed/Used  Reviewed diaper output with mother as days progress.  Looking for seedy yellow stools at day 5-6. Informed mom of ways to tell for sufficient milk transfer and revisited the idea of a referral to the pediatric dentist.  [facility] intern was able to assess latch on both breasts.  Mom insists on baby self-latching but baby is unable to sustain a seal and is latching shallow.  With assistance from [facility] intern, baby is able to form a seal, with breast sandwiched, and a deeper latch is attained.  Mom was taught the cross cradle position to better support and sandwich her breast while baby nurses.  At the end of the consult, [facility] intern reviewed outpatient services and provided phone numbers and breastfeeding group information to get support. Mom feels confident in her breastfeeding journey and is very excited to go home.   Consult Status Consult Status: Complete Date: 05/22/19 Follow-up type: [place] 05/22/2019, 3:23 PM

## 2019-05-22 NOTE — Clinical Social Work Maternal (Signed)
  CLINICAL SOCIAL WORK MATERNAL/CHILD NOTE  Patient Details  Name: COLETTA LOCKNER MRN: [id number] Date of Birth: Oct 02, 1980  Date:  05/22/2019  Clinical Social Worker Initiating Note:  [employer], LCSW Date/Time: Initiated:  05/22/19/1230     Child's Name:  Gilles Chiquito   Biological Parents:  Mother, Father   Need for Interpreter:  None   Reason for Referral:  Behavioral Health Concerns   Address:  [place] [address]    Phone number:  [phone] (home)     Additional phone number:   Household Members/Support Persons (HM/SP):   Household Member/Support Person 1   HM/SP Name Relationship DOB or Age  HM/SP -1 Ricky Stabs    HM/SP -2        HM/SP -3        HM/SP -4        HM/SP -5        HM/SP -6        HM/SP -7        HM/SP -8          Natural Supports (not living in the home):  Extended Family, Immediate Family   Professional Supports:     Employment: Homemaker   Type of Work:     Education:  Nurse, adult   Homebound arranged:    Museum/gallery curator Resources:  Medicaid   Other Resources:  [facility]   Cultural/Religious Considerations Which May Impact Care:    Strengths:  Ability to meet basic needs , Compliance with medical plan    Psychotropic Medications:         Pediatrician:       Pediatrician List:   [place]      Pediatrician Fax Number:    Risk Factors/Current Problems:  None   Cognitive State:  Alert , Linear Thinking    Mood/Affect:  Happy , Calm    CSW Assessment: Clinical Social Worker (CSW) received consult that mother has bipolar disorder. Per RN there are no concerns and mother and infant were not drug tested. CSW met with mother, father and infant at bedside. CSW introduced self and explained role of CSW department. Mother reported that she lives in [place] with her fiance and father of the  infant Shanon Brow. Per mother between her a Shanon Brow this is their 3rd child, first child together. Mother reported that she has all the supplies needed for infant. Mother reported that she is not depressed or anxious. CSW provided mother with a list of [facility] resources. Mother accepted resources and thanked CSW for visit. Please reconsult if future social work needs arise. CSW signing off.   CSW Plan/Description:  No Further Intervention Required/No Barriers to Discharge    Caylen Yardley, Lenice Llamas 05/22/2019, 5:24 PM

## 2019-05-23 ENCOUNTER — Encounter: Admission: RE | Payer: Self-pay | Source: Home / Self Care

## 2019-05-23 ENCOUNTER — Inpatient Hospital Stay
Admission: RE | Admit: 2019-05-23 | Payer: Medicaid Other | Source: Home / Self Care | Admitting: Obstetrics and Gynecology

## 2019-05-23 DIAGNOSIS — L03211 Cellulitis of face: Secondary | ICD-10-CM | POA: Diagnosis present

## 2019-05-23 LAB — CBC
HCT: 36.7 % (ref 36.0–46.0)
Hemoglobin: 12.4 g/dL (ref 12.0–15.0)
MCH: 29.1 pg (ref 26.0–34.0)
MCHC: 33.8 g/dL (ref 30.0–36.0)
MCV: 86.2 fL (ref 80.0–100.0)
Platelets: 238 10*3/uL (ref 150–400)
RBC: 4.26 MIL/uL (ref 3.87–5.11)
RDW: 13.4 % (ref 11.5–15.5)
WBC: 5.6 10*3/uL (ref 4.0–10.5)
nRBC: 0 % (ref 0.0–0.2)

## 2019-05-23 LAB — BASIC METABOLIC PANEL
Anion gap: 9 (ref 5–15)
BUN: 10 mg/dL (ref 6–20)
CO2: 26 mmol/L (ref 22–32)
Calcium: 8.3 mg/dL — ABNORMAL LOW (ref 8.9–10.3)
Chloride: 107 mmol/L (ref 98–111)
Creatinine, Ser: 0.64 mg/dL (ref 0.44–1.00)
GFR calc Af Amer: >60 mL/min (ref >60)
GFR calc non Af Amer: >60 mL/min (ref >60)
Glucose, Bld: 89 mg/dL (ref 70–99)
Potassium: 3.3 mmol/L — ABNORMAL LOW (ref 3.5–5.1)
Sodium: 142 mmol/L (ref 135–145)

## 2019-05-23 LAB — VANCOMYCIN, TROUGH: Vancomycin Tr: 10 ug/mL — ABNORMAL LOW (ref 15–20)

## 2019-05-23 SURGERY — Surgical Case
Anesthesia: Choice

## 2019-05-23 MED ORDER — CLINDAMYCIN HCL 150 MG PO CAPS: 450.0000 mg | ORAL_CAPSULE | Freq: Three times a day (TID) | ORAL | 0 refills | Status: AC

## 2019-05-23 NOTE — Progress Notes (Signed)
Discharge instructions reviewed with the patient. Pt instructed to follow up at [facility] on 05/25/19 at 1330 with Dr. Gilman Schmidt. Pt verbalized understanding of teaching and has no further questions at this time. Pt d/c in wheelchair with her husband and infant.

## 2019-05-23 NOTE — Progress Notes (Signed)
Pharmacy Antibiotic Note  Stacey Dunn is a 38 y.o. female admitted on 05/19/2019 with cellulitis and MRSA infection.  Pharmacy has been consulted for Vancomycin dosing.   Plan: Vancomycin 2 gm IV X 1 given on 8/28 @ 2100. Vancomycin 1 gm Iv Q12H ordered to start on 8/29 @ 0900. Peak and trough ordered for 9/1 PM dose. Will f/u with levels. Scr stable.    Height: 5\' 4"  (162.6 cm) Weight: 223 lb 4.8 oz (101.3 kg) IBW/kg (Calculated) : 54.7  Temp (24hrs), Avg:98.2 F (36.8 C), Min:97.8 F (36.6 C), Max:98.5 F (36.9 C)  Recent Labs  Lab 05/19/19 1901 05/20/19 0428 05/23/19 1006 05/23/19 1249  WBC 15.1* 11.8* 5.6  --   CREATININE 0.61  --  0.64  --   VANCOTROUGH  --   --   --  10*    Estimated Creatinine Clearance: 113.6 mL/min (by C-G formula based on SCr of 0.64 mg/dL).    Allergies  Allergen Reactions  . Meloxicam Swelling  . Morphine And Related   . Penicillins     Hives Did it involve swelling of the face/tongue/throat, SOB, or low BP? No Did it involve sudden or severe rash/hives, skin peeling, or any reaction on the inside of your mouth or nose? Yes Did you need to seek medical attention at a hospital or doctor's office? Yes When did it last happen?childhood  If all above answers are "NO", may proceed with cephalosporin use.   Marland Kitchen Morphine Hives, Other (See Comments) and Rash    hallucinations   . Sulfa Antibiotics Hives, Other (See Comments) and Rash    unknown     Thank you for allowing pharmacy to be a part of this patient's care.  Oswald Hillock 05/23/2019 3:38 PM

## 2019-05-23 NOTE — Progress Notes (Signed)
  Patient seen in the hallway while being wheeled towards the exit. She is feeling better. The infected nodule on the chin is less painful The culture is MRSA and it is sensitive to clindamycin. Patient Vitals for the past 24 hrs:  BP Temp Temp src Pulse Resp SpO2  05/23/19 1532 132/72 98 F (36.7 C) Oral 80 14 -  05/23/19 0727 (!) 142/81 97.8 F (36.6 C) Oral 87 15 99 %  05/22/19 2356 127/76 98.5 F (36.9 C) Oral 85 18 99 %   Lab Results Recent Labs    05/23/19 1006  WBC 5.6  HGB 12.4  HCT 36.7  NA 142  K 3.3*  CL 107  CO2 26  BUN 10  CREATININE 0.64    Studies/Results: Ct Soft Tissue Neck W Contrast  Result Date: 05/22/2019 CLINICAL DATA:  Soft tissue swelling of the chin. History MRSA infection. EXAM: CT NECK WITH CONTRAST TECHNIQUE: Multidetector CT imaging of the neck was performed using the standard protocol following the bolus administration of intravenous contrast. CONTRAST:  8mL OMNIPAQUE IOHEXOL 300 MG/ML  SOLN COMPARISON:  None. FINDINGS: Pharynx and larynx: No mucosal or submucosal lesion is seen. Salivary glands: Parotid and submandibular glands are normal. Thyroid: Normal Lymph nodes: Slight reactive prominence submandibular nodes the right of midline. No advanced adenopathy or low-density nodes. Vascular: Normal Limited intracranial: Normal Visualized orbits: Normal Mastoids and visualized paranasal sinuses: Clear Skeleton: Mild midcervical spondylosis. Upper chest: Normal Other: Nonspecific soft tissue swelling the chin and submandibular soft tissues more to the right of midline. Skin defect evident on the right side. The underlying fat shows inflammatory edema but without evidence of a drainable abscess or any communication with the deep structures. IMPRESSION: Nonspecific cellulitis pattern of the chin and submandibular region on the right. Skin defect visible clinically is also visible by CT. No evidence of drainable abscess. Mild reactive nodal prominence of the  submandibular lymph nodes on the right. Electronically Signed   By: Nelson Chimes M.D.   On: 05/22/2019 16:32     Assessment/Plan:  MRSA soft tissue infection of the chin.  Sensitive to clindamycin.  We will start her on p.o. clindamycin 450 mg every 8 hours for 10 days.  She will follow-up with me as outpatient next week.  We will also follow-up with OB.  Also take a probiotic with the antibiotic. Discussed with the primary team.

## 2019-05-24 LAB — HIV ANTIBODY (ROUTINE TESTING W REFLEX): HIV Screen 4th Generation wRfx: NONREACTIVE

## 2019-05-25 ENCOUNTER — Encounter: Payer: Self-pay | Admitting: Obstetrics and Gynecology

## 2019-05-25 ENCOUNTER — Other Ambulatory Visit: Payer: Self-pay

## 2019-05-25 ENCOUNTER — Ambulatory Visit (INDEPENDENT_AMBULATORY_CARE_PROVIDER_SITE_OTHER): Payer: Medicaid Other | Admitting: Obstetrics and Gynecology

## 2019-05-25 DIAGNOSIS — B9562 Methicillin resistant Staphylococcus aureus infection as the cause of diseases classified elsewhere: Secondary | ICD-10-CM

## 2019-05-25 DIAGNOSIS — L0390 Cellulitis, unspecified: Secondary | ICD-10-CM

## 2019-05-25 DIAGNOSIS — O165 Unspecified maternal hypertension, complicating the puerperium: Secondary | ICD-10-CM

## 2019-05-25 LAB — POCT URINALYSIS DIPSTICK OB: Glucose, UA: NEGATIVE

## 2019-05-25 NOTE — Progress Notes (Signed)
Patient ID: Stacey Dunn, female   DOB: [birth year], 38 y.o.   MRN: [id number]  Reason for Consult: Postpartum Care (Swelling in legs, headaches. Pain is about 8/10 without medicine )   Referred by Elisabeth Cara, NP  Subjective:     HPI:  Stacey Dunn is a 38 y.o. female. She is following up today for her chin folliculitis. She reports it has been improving and is draining less. She has a bandage in place. She denies fevers. She is washing it twice a day and applying ointment. She is taking her oral antibiotics.  She has noticed swelling of her bilateral lower extremities. She has a mild headache which she attributes to the fact that she has not slept in several nights.  She is otherwise doing well.   Past Medical History:  Diagnosis Date  . 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 Bipolar 2 disorder (HCC)    no meds currently  . Caries   . Constipation   . COPD (chronic obstructive pulmonary disease) ([place])   . Depression    History -no meds  . History of borderline personality disorder    no meds currently  . History of chest pain    Per client, eval at [facility] ED 2017 - 2018. "Nodules right lung."  No follow-up by client.  . Hypertension    History "PIH"  . Migraine    History  . Nausea & vomiting    associated with pregnancy  . Varicosities of leg   . Wears partial dentures    Upper   Family History  Problem Relation Age of Onset  . Kidney disease Mother   . Renal Disease Mother   . Hypertension Mother   . Alcohol abuse Father   . Hypertension Sister   . Cancer Sister   . Heart Problems Sister   . Cancer Maternal Uncle   . Cancer Maternal Grandfather   . Cancer Paternal Grandfather   . Diabetes Paternal Grandfather   . Epilepsy Sister   . Migraines Sister    Past Surgical History:  Procedure Laterality Date  . ANKLE SURGERY Right    For Staph infection  . CESAREAN SECTION    . CESAREAN SECTION  N/A 05/19/2019   Procedure: CESAREAN SECTION- Repeat SEX: FEMALE TOB 2303 weight 9lbs 0oz apgars: 63min:9, 31min:9;  Surgeon: [name], MD;  Location: [facility] ORS;  Service: Obstetrics;  Laterality: N/A;    Short Social History:  Social History   Tobacco Use  . Smoking status: Current Every Day Smoker    Packs/day: 0.25    Years: 21.00    Pack years: 5.25    Types: Cigarettes  . Smokeless tobacco: Never Used  Substance Use Topics  . Alcohol use: Not Currently    Allergies  Allergen Reactions  . Meloxicam Swelling  . Morphine And Related   . Penicillins     Hives Did it involve swelling of the face/tongue/throat, SOB, or low BP? No Did it involve sudden or severe rash/hives, skin peeling, or any reaction on the inside of your mouth or nose? Yes Did you need to seek medical attention at a hospital or doctor's office? Yes When did it last happen?childhood  If all above answers are "NO", may proceed with cephalosporin use.   Stacey Dunn Morphine Hives, Other (See Comments) and Rash    hallucinations   . Sulfa Antibiotics Hives, Other (See Comments)  and Rash    unknown     Current Outpatient Medications  Medication Sig Dispense Refill  . clindamycin (CLEOCIN) 150 MG capsule Take 3 capsules (450 mg total) by mouth 3 (three) times daily for 10 days. 90 capsule 0  . mupirocin ointment (BACTROBAN) 2 % Apply topically 2 (two) times daily. 22 g 0  . oxyCODONE (OXY IR/ROXICODONE) 5 MG immediate release tablet Take 1-2 tablets (5-10 mg total) by mouth every 6 (six) hours as needed for up to 5 days for moderate pain or severe pain. 20 tablet 0  . Prenatal Vit-Fe Fumarate-FA (PRENATAL MULTIVITAMIN) TABS tablet Take 1 tablet by mouth daily.     Stacey Dunn. [START ON 06/04/2019] norethindrone (MICRONOR) 0.35 MG tablet Take 1 tablet (0.35 mg total) by mouth daily. (Patient not taking: Reported on 05/25/2019) 3 Package 4   No current facility-administered medications for this visit.     REVIEW OF  SYSTEMS      Objective:  Objective   Vitals:   05/25/19 1325  BP: 140/84  Weight: 209 lb (94.8 kg)  Height: 5\' 4"  (1.626 m)   Body mass index is 35.87 kg/m.  Physical Exam Vitals signs and nursing note reviewed.  Constitutional:      Appearance: She is well-developed.  HENT:     Head: Normocephalic and atraumatic.     Comments: Improved induration of chin. Minimal erythema. Bandage clean.  Eyes:     Pupils: Pupils are equal, round, and reactive to light.  Cardiovascular:     Rate and Rhythm: Normal rate and regular rhythm.  Pulmonary:     Effort: Pulmonary effort is normal. No respiratory distress.  Abdominal:     General: Abdomen is flat.     Palpations: Abdomen is soft.     Comments: Incision is healing well. Clean, dry and intact.  Musculoskeletal:     Right lower leg: Edema present.     Left lower leg: Edema present.     Comments: +1 bilateral edema Limbs equal in size. No erythema. No skin changes.   Skin:    General: Skin is warm and dry.  Neurological:     Mental Status: She is alert and oriented to person, place, and time.  Psychiatric:        Behavior: Behavior normal.        Thought Content: Thought content normal.        Judgment: Judgment normal.        Assessment/Plan:     38 yo s/p cesarean section complicated by facial folliculitis and history of MRSA infection.  1. Blood pressure elevation-  Mild elevation and trace protein on urine dip. Will check labs today. Discussed warning signs of Pre-eclampsia and when to follow up. Will see again early next week to repeat BP check. Labs obtained today.   2. MRSA fascial folliculitis- Chin healing well. Continue care and regular cleaning. Follow up early next week to monitor process.  Continue oral antibiotics. Continue cleansing and care.   3. Discussed cesarean section incision care.   Follow up early next week.   More than 25 minutes were spent face to face with the patient in the room with more  than 50% of the time spent providing counseling and discussing the plan of management.    Adelene Idlerhristanna Schuman MD [facility], [facility] 05/25/2019 1:58 PM

## 2019-05-26 LAB — COMPREHENSIVE METABOLIC PANEL
ALT: 43 IU/L — ABNORMAL HIGH (ref 0–32)
AST: 39 IU/L (ref 0–40)
Albumin/Globulin Ratio: 1.3 (ref 1.2–2.2)
Albumin: 3.5 g/dL — ABNORMAL LOW (ref 3.8–4.8)
Alkaline Phosphatase: 107 IU/L (ref 39–117)
BUN/Creatinine Ratio: 14 (ref 9–23)
BUN: 11 mg/dL (ref 6–20)
Bilirubin Total: 0.3 mg/dL (ref 0.0–1.2)
CO2: 24 mmol/L (ref 20–29)
Calcium: 9.2 mg/dL (ref 8.7–10.2)
Chloride: 104 mmol/L (ref 96–106)
Creatinine, Ser: 0.78 mg/dL (ref 0.57–1.00)
GFR calc Af Amer: 114 mL/min/{1.73_m2} (ref >59)
GFR calc non Af Amer: 99 mL/min/{1.73_m2} (ref >59)
Globulin, Total: 2.8 g/dL (ref 1.5–4.5)
Glucose: 81 mg/dL (ref 65–99)
Potassium: 3.9 mmol/L (ref 3.5–5.2)
Sodium: 143 mmol/L (ref 134–144)
Total Protein: 6.3 g/dL (ref 6.0–8.5)

## 2019-05-26 LAB — CBC
Hematocrit: 43.2 % (ref 34.0–46.6)
Hemoglobin: 14.7 g/dL (ref 11.1–15.9)
MCH: 29.1 pg (ref 26.6–33.0)
MCHC: 34 g/dL (ref 31.5–35.7)
MCV: 86 fL (ref 79–97)
Platelets: 323 10*3/uL (ref 150–450)
RBC: 5.05 x10E6/uL (ref 3.77–5.28)
RDW: 13.1 % (ref 11.7–15.4)
WBC: 6.2 10*3/uL (ref 3.4–10.8)

## 2019-05-26 LAB — PROTEIN / CREATININE RATIO, URINE
Creatinine, Urine: 66.6 mg/dL
Protein, Ur: 14.7 mg/dL
Protein/Creat Ratio: 221 mg/g creat — ABNORMAL HIGH (ref 0–200)

## 2019-05-26 LAB — AEROBIC/ANAEROBIC CULTURE W GRAM STAIN (SURGICAL/DEEP WOUND): Gram Stain: NONE SEEN

## 2019-05-26 NOTE — Progress Notes (Signed)
Called and discussed normal results with patient. She reports still having a headache and swelling of her feet. She will check her BP at home and follow up in the ER if this value is elevated above 140/90 and the headache persists despite rest and tylenol.

## 2019-05-31 ENCOUNTER — Ambulatory Visit (INDEPENDENT_AMBULATORY_CARE_PROVIDER_SITE_OTHER): Payer: Medicaid Other | Admitting: Obstetrics and Gynecology

## 2019-05-31 ENCOUNTER — Other Ambulatory Visit: Payer: Self-pay

## 2019-05-31 ENCOUNTER — Encounter: Payer: Self-pay | Admitting: Obstetrics and Gynecology

## 2019-05-31 DIAGNOSIS — B9562 Methicillin resistant Staphylococcus aureus infection as the cause of diseases classified elsewhere: Secondary | ICD-10-CM

## 2019-05-31 DIAGNOSIS — L0390 Cellulitis, unspecified: Secondary | ICD-10-CM

## 2019-06-04 NOTE — Progress Notes (Signed)
Patient ID: Stacey PyleAmber N Shedd, female   DOB: 1980/10/31, 38 y.o.   MRN: [id number]  Reason for Consult: Postpartum Care   Referred by Martie RoundSpencer, Nicole, NP  Subjective:     HPI:  Stacey Pylember N Belinsky is a 38 y.o. female. She presents today for follow up of her chin cellulitis. She has been taking her clindamycin. She is washing her face and cleansing the would twice a dya. She is applying the mupirocin ointment. She has not yet been able to follow up with infectious disease.   Past Medical History:  Diagnosis Date  . 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 Bipolar 2 disorder (HCC)    no meds currently  . Caries   . Constipation   . COPD (chronic obstructive pulmonary disease) (HCC)   . Depression    History -no meds  . History of borderline personality disorder    no meds currently  . History of chest pain    Per client, eval [facility] 2017 - 2018. "Nodules right lung."  No follow-up by client.  . Hypertension    History "PIH"  . Migraine    History  . Nausea & vomiting    associated with pregnancy  . Varicosities of leg   . Wears partial dentures    Upper   Family History  Problem Relation Age of Onset  . Kidney disease Mother   . Renal Disease Mother   . Hypertension Mother   . Alcohol abuse Father   . Hypertension Sister   . Cancer Sister   . Heart Problems Sister   . Cancer Maternal Uncle   . Cancer Maternal Grandfather   . Cancer Paternal Grandfather   . Diabetes Paternal Grandfather   . Epilepsy Sister   . Migraines Sister    Past Surgical History:  Procedure Laterality Date  . ANKLE SURGERY Right    For Staph infection  . CESAREAN SECTION    . CESAREAN SECTION N/A 05/19/2019   Procedure: CESAREAN SECTION- Repeat SEX: FEMALE TOB 2303 weight 9lbs 0oz apgars: 321min:9, 245min:9;  Surgeon: Natale MilchSchuman, Sequoyah Counterman R, MD;  Location: [facility] ORS;  Service: Obstetrics;  Laterality: N/A;    Short Social History:  Social  History   Tobacco Use  . Smoking status: Current Every Day Smoker    Packs/day: 0.25    Years: 21.00    Pack years: 5.25    Types: Cigarettes  . Smokeless tobacco: Never Used  Substance Use Topics  . Alcohol use: Not Currently    Allergies  Allergen Reactions  . Meloxicam Swelling  . Morphine And Related   . Penicillins     Hives Did it involve swelling of the face/tongue/throat, SOB, or low BP? No Did it involve sudden or severe rash/hives, skin peeling, or any reaction on the inside of your mouth or nose? Yes Did you need to seek medical attention at a hospital or doctor's office? Yes When did it last happen?childhood  If all above answers are "NO", may proceed with cephalosporin use.   Marland Kitchen. Morphine Hives, Other (See Comments) and Rash    hallucinations   . Sulfa Antibiotics Hives, Other (See Comments) and Rash    unknown     Current Outpatient Medications  Medication Sig Dispense Refill  . mupirocin ointment (BACTROBAN) 2 % Apply topically 2 (two) times daily. 22 g 0  . norethindrone (MICRONOR) 0.35 MG tablet Take 1 tablet (  0.35 mg total) by mouth daily. (Patient not taking: Reported on 05/25/2019) 3 Package 4  . Prenatal Vit-Fe Fumarate-FA (PRENATAL MULTIVITAMIN) TABS tablet Take 1 tablet by mouth daily.      No current facility-administered medications for this visit.     Review of Systems  Constitutional: Negative for chills, fatigue, fever and unexpected weight change.  HENT: Negative for trouble swallowing.  Eyes: Negative for loss of vision.  Respiratory: Negative for cough, shortness of breath and wheezing.  Cardiovascular: Negative for chest pain, leg swelling, palpitations and syncope.  GI: Negative for abdominal pain, blood in stool, diarrhea, nausea and vomiting.  GU: Negative for difficulty urinating, dysuria, frequency and hematuria.  Musculoskeletal: Negative for back pain, leg pain and joint pain.  Skin: Negative for rash.  Neurological:  Negative for dizziness, headaches, light-headedness, numbness and seizures.  Psychiatric: Negative for behavioral problem, confusion, depressed mood and sleep disturbance.        Objective:  Objective   Vitals:   05/31/19 1626  BP: 128/82  Pulse: (!) 107  Weight: 200 lb (90.7 kg)  Height: 5\' 4"  (1.626 m)   Body mass index is 34.33 kg/m.  Physical Exam Vitals signs and nursing note reviewed.  Constitutional:      Appearance: She is well-developed.  HENT:     Head: Normocephalic and atraumatic.     Comments: Cellulitis is improving. Still indurated, but no drainage from wound and erythema is improved.  Eyes:     Pupils: Pupils are equal, round, and reactive to light.  Cardiovascular:     Rate and Rhythm: Normal rate and regular rhythm.  Pulmonary:     Effort: Pulmonary effort is normal. No respiratory distress.  Abdominal:     General: Abdomen is flat.     Palpations: Abdomen is soft.     Comments: Cesarean section incision is clean, dry and intact. No erythema. No induration.   Skin:    General: Skin is warm and dry.  Neurological:     Mental Status: She is alert and oriented to person, place, and time.  Psychiatric:        Behavior: Behavior normal.        Thought Content: Thought content normal.        Judgment: Judgment normal.             Assessment/Plan:     38 yo s/p cesarean section with secondary facial cellulitis.  Continue with oral antibiotics.  Continue with facial cleansing and application of ointment. Continue routine care for cesarean section incision.  Discussed with the patient treatment for being MRSA carrier. Recommended discussing further with infectious disease for their opinion.   More than 10 minutes were spent face to face with the patient in the room with more than 50% of the time spent providing counseling and discussing the plan of management.       Adrian Prows MD [facility], [facility] 06/04/2019  9:50 AM

## 2019-06-06 ENCOUNTER — Ambulatory Visit: Payer: Medicaid Other | Attending: Infectious Diseases | Admitting: Infectious Diseases

## 2019-06-06 ENCOUNTER — Other Ambulatory Visit: Payer: Self-pay

## 2019-06-06 DIAGNOSIS — L0201 Cutaneous abscess of face: Secondary | ICD-10-CM

## 2019-06-06 DIAGNOSIS — B9562 Methicillin resistant Staphylococcus aureus infection as the cause of diseases classified elsewhere: Secondary | ICD-10-CM | POA: Diagnosis not present

## 2019-06-06 NOTE — Progress Notes (Signed)
The purpose of this virtual visit is to provide medical care while limiting exposure to the novel coronavirus (COVID19) for both patient and office staff.   Consent was obtained for TELEMED visit:  Yes.   Answered questions that patient had about telehealth interaction:  Yes.   I discussed the limitations, risks, security and privacy concerns of performing an evaluation and management service by telephone. I also discussed with the patient that there may be a patient responsible charge related to this service. The patient expressed understanding and agreed to proceed.   Patient Location: Home Provider Location:office  Tele-visit for follow-up of MRSA abscess on her chin. Stacey Dunn is 38 year old female who was recently in [place] 05/19/2019 until 05/23/2019.  She was in labor and had her C-section.  She she complained of her swelling in her chin which had started as a infected hair follicle that had gotten bigger and very swollen.  Dr. Nechama Guard her OB physician suspected MRSA started her on vancomycin after sending cultures from the abscess 05/21/2019 that was discharging pus.  The culture was positive for MRSA I saw the patient during her hospitalization.  After being on IV vancomycin for 3 days she was switched over to clindamycin orally before discharge.  Today she says she is doing much better.  The abscess and surrounding area of erythema and swelling has gone down a lot.  There is not much discharge coming from the wound.  She is covered with a Band-Aid.  Her baby is doing well with no evidence of boils or abscess. Patient does not have any diarrhea or rash or fever  I saw her through the tele monitor and took a picture of the wound     This looks much better now.  MRSA infection of the chin.  Has finished 14 days of clindamycin.  She does not need to take anymore. Informed patient that if she notices new lesions that she will have to report to me as she may be colonized with MRSA and we  may have to decolonize her with mupirocin and chlorhexidine wash.  Patient understood the plan and will contact me as needed.

## 2019-06-21 ENCOUNTER — Encounter: Payer: Self-pay | Admitting: Obstetrics and Gynecology

## 2019-06-21 ENCOUNTER — Ambulatory Visit (INDEPENDENT_AMBULATORY_CARE_PROVIDER_SITE_OTHER): Payer: Medicaid Other | Admitting: Obstetrics and Gynecology

## 2019-06-21 ENCOUNTER — Other Ambulatory Visit: Payer: Self-pay

## 2019-06-21 DIAGNOSIS — Z1389 Encounter for screening for other disorder: Secondary | ICD-10-CM

## 2019-06-27 NOTE — Progress Notes (Signed)
  OBSTETRICS POSTPARTUM CLINIC PROGRESS NOTE  Subjective:     Stacey Dunn is a 38 y.o. G8P2002 female who presents for a postpartum visit. She is 6 weeks postpartum following a Term pregnancy and delivery by C-section.  I have fully reviewed the prenatal and intrapartum course. Anesthesia: spinal.  Postpartum course has been complicated by complicated by MRSA infection of chin.  Baby is feeding by Breast.  Bleeding: patient has not  resumed menses.  Bowel function is normal. Bladder function is normal.  Patient is sexually active. Contraception method desired is oral progesterone-only contraceptive.  Postpartum depression screening: negative. Edinburgh 8.  The following portions of the patient's history were reviewed and updated as appropriate: allergies, current medications, past family history, past medical history, past social history, past surgical history and problem list.  Review of Systems Pertinent items are noted in HPI.  Objective:    BP 112/70   Pulse 93   Ht 5\' 4"  (1.626 m)   Wt 193 lb (87.5 kg)   Breastfeeding Yes   BMI 33.13 kg/m   General:  alert and no distress   Breasts:  inspection negative, no nipple discharge or bleeding, no masses or nodularity palpable  Lungs: clear to auscultation bilaterally  Heart:  regular rate and rhythm, S1, S2 normal, no murmur, click, rub or gallop  Abdomen: soft, non-tender; bowel sounds normal; no masses,  no organomegaly.   Well healed Pfannenstiel incision   Vulva:  normal  Vagina: normal vagina, no discharge, exudate, lesion, or erythema  Cervix:  no cervical motion tenderness and no lesions  Corpus: normal size, contour, position, consistency, mobility, non-tender  Adnexa:  normal adnexa and no mass, fullness, tenderness  Rectal Exam: Not performed.          Assessment:  Post Partum Care visit There are no diagnoses linked to this encounter.  Plan:  See orders and Patient Instructions Follow up in: 12 months   or  as needed.   Adrian Prows MD [facility], [facility] 06/27/2019 7:09 AM

## 2020-05-29 ENCOUNTER — Ambulatory Visit (LOCAL_COMMUNITY_HEALTH_CENTER): Payer: Medicaid Other

## 2020-05-29 ENCOUNTER — Other Ambulatory Visit: Payer: Self-pay

## 2020-05-29 VITALS — BP 112/74 | HR 84 | Ht 64.0 in | Wt 192.0 lb

## 2020-05-29 DIAGNOSIS — Z3201 Encounter for pregnancy test, result positive: Secondary | ICD-10-CM | POA: Diagnosis not present

## 2020-05-29 LAB — PREGNANCY, URINE: Preg Test, Ur: POSITIVE — AB

## 2020-05-29 NOTE — Progress Notes (Signed)
Reports taking an OTC sleep medicine every night, but does not know name of medicine. Counseled to stop taking unless can verify safe in first trimester pregnancy. Client verbalized understanding. Reports LMP a little heavy than usual and lasted 1 - 2 extra days. After menses ceased, had 1 - 2 days of brownish spotting. Mild cramps during menses. Jossie Ng, RN

## 2020-06-13 ENCOUNTER — Other Ambulatory Visit: Payer: Self-pay

## 2020-06-13 ENCOUNTER — Emergency Department: Payer: Medicaid Other

## 2020-06-13 DIAGNOSIS — O26891 Other specified pregnancy related conditions, first trimester: Secondary | ICD-10-CM | POA: Diagnosis not present

## 2020-06-13 DIAGNOSIS — Z5321 Procedure and treatment not carried out due to patient leaving prior to being seen by health care provider: Secondary | ICD-10-CM | POA: Insufficient documentation

## 2020-06-13 DIAGNOSIS — M545 Low back pain: Secondary | ICD-10-CM | POA: Diagnosis not present

## 2020-06-13 DIAGNOSIS — O208 Other hemorrhage in early pregnancy: Secondary | ICD-10-CM | POA: Diagnosis present

## 2020-06-13 DIAGNOSIS — Z3A01 Less than 8 weeks gestation of pregnancy: Secondary | ICD-10-CM | POA: Diagnosis not present

## 2020-06-13 DIAGNOSIS — R1030 Lower abdominal pain, unspecified: Secondary | ICD-10-CM | POA: Insufficient documentation

## 2020-06-13 DIAGNOSIS — N939 Abnormal uterine and vaginal bleeding, unspecified: Secondary | ICD-10-CM | POA: Diagnosis not present

## 2020-06-13 LAB — CBC
HCT: 44.1 % (ref 36.0–46.0)
Hemoglobin: 15 g/dL (ref 12.0–15.0)
MCH: 29.2 pg (ref 26.0–34.0)
MCHC: 34 g/dL (ref 30.0–36.0)
MCV: 85.8 fL (ref 80.0–100.0)
Platelets: 289 10*3/uL (ref 150–400)
RBC: 5.14 MIL/uL — ABNORMAL HIGH (ref 3.87–5.11)
RDW: 13.2 % (ref 11.5–15.5)
WBC: 8.9 10*3/uL (ref 4.0–10.5)
nRBC: 0 % (ref 0.0–0.2)

## 2020-06-13 LAB — COMPREHENSIVE METABOLIC PANEL
ALT: 45 U/L — ABNORMAL HIGH (ref 0–44)
AST: 25 U/L (ref 15–41)
Albumin: 4.1 g/dL (ref 3.5–5.0)
Alkaline Phosphatase: 39 U/L (ref 38–126)
Anion gap: 8 (ref 5–15)
BUN: 6 mg/dL (ref 6–20)
CO2: 27 mmol/L (ref 22–32)
Calcium: 8.7 mg/dL — ABNORMAL LOW (ref 8.9–10.3)
Chloride: 103 mmol/L (ref 98–111)
Creatinine, Ser: 0.84 mg/dL (ref 0.44–1.00)
GFR calc Af Amer: >60 mL/min (ref >60)
GFR calc non Af Amer: >60 mL/min (ref >60)
Glucose, Bld: 87 mg/dL (ref 70–99)
Potassium: 3.4 mmol/L — ABNORMAL LOW (ref 3.5–5.1)
Sodium: 138 mmol/L (ref 135–145)
Total Bilirubin: 0.6 mg/dL (ref 0.3–1.2)
Total Protein: 7.1 g/dL (ref 6.5–8.1)

## 2020-06-13 LAB — URINALYSIS, COMPLETE (UACMP) WITH MICROSCOPIC
Bilirubin Urine: NEGATIVE
Glucose, UA: NEGATIVE mg/dL
Ketones, ur: NEGATIVE mg/dL
Leukocytes,Ua: NEGATIVE
Nitrite: NEGATIVE
Protein, ur: NEGATIVE mg/dL
Specific Gravity, Urine: 1.003 — ABNORMAL LOW (ref 1.005–1.030)
pH: 6 (ref 5.0–8.0)

## 2020-06-13 LAB — HCG, QUANTITATIVE, PREGNANCY: hCG, Beta Chain, Quant, S: 698 m[IU]/mL — ABNORMAL HIGH (ref <5)

## 2020-06-13 NOTE — ED Triage Notes (Signed)
Pt in with co vaginal bleeding that started today, pt is [redacted] weeks pregnant. States also having low back and low abd cramping.

## 2020-06-14 ENCOUNTER — Emergency Department
Admission: EM | Admit: 2020-06-14 | Discharge: 2020-06-14 | Disposition: A | Discharge status: Home / Self Care | Payer: Medicaid Other | Attending: Emergency Medicine | Admitting: Emergency Medicine

## 2020-06-14 DIAGNOSIS — O209 Hemorrhage in early pregnancy, unspecified: Secondary | ICD-10-CM

## 2020-06-14 LAB — ABO/RH: ABO/RH(D): O NEG

## 2021-10-07 ENCOUNTER — Emergency Department (HOSPITAL_COMMUNITY): Payer: Medicaid Other

## 2021-10-07 ENCOUNTER — Emergency Department (HOSPITAL_COMMUNITY)
Admission: EM | Admit: 2021-10-07 | Discharge: 2021-10-08 | Disposition: A | Discharge status: Home / Self Care | Payer: Medicaid Other | Attending: Emergency Medicine | Admitting: Emergency Medicine

## 2021-10-07 ENCOUNTER — Other Ambulatory Visit: Payer: Self-pay

## 2021-10-07 ENCOUNTER — Encounter (HOSPITAL_COMMUNITY): Payer: Self-pay | Admitting: *Deleted

## 2021-10-07 DIAGNOSIS — R1032 Left lower quadrant pain: Secondary | ICD-10-CM | POA: Insufficient documentation

## 2021-10-07 DIAGNOSIS — R112 Nausea with vomiting, unspecified: Secondary | ICD-10-CM | POA: Insufficient documentation

## 2021-10-07 DIAGNOSIS — D72829 Elevated white blood cell count, unspecified: Secondary | ICD-10-CM | POA: Diagnosis not present

## 2021-10-07 DIAGNOSIS — R197 Diarrhea, unspecified: Secondary | ICD-10-CM | POA: Diagnosis not present

## 2021-10-07 DIAGNOSIS — O3680X0 Pregnancy with inconclusive fetal viability, not applicable or unspecified: Secondary | ICD-10-CM | POA: Insufficient documentation

## 2021-10-07 DIAGNOSIS — E876 Hypokalemia: Secondary | ICD-10-CM | POA: Diagnosis not present

## 2021-10-07 HISTORY — DX: Endometriosis, unspecified: N80.9

## 2021-10-07 LAB — CBC
HCT: 45.4 % (ref 36.0–46.0)
Hemoglobin: 15.7 g/dL — ABNORMAL HIGH (ref 12.0–15.0)
MCH: 29.6 pg (ref 26.0–34.0)
MCHC: 34.6 g/dL (ref 30.0–36.0)
MCV: 85.5 fL (ref 80.0–100.0)
Platelets: 283 10*3/uL (ref 150–400)
RBC: 5.31 MIL/uL — ABNORMAL HIGH (ref 3.87–5.11)
RDW: 13 % (ref 11.5–15.5)
WBC: 11.1 10*3/uL — ABNORMAL HIGH (ref 4.0–10.5)
nRBC: 0 % (ref 0.0–0.2)

## 2021-10-07 LAB — URINALYSIS, ROUTINE W REFLEX MICROSCOPIC
Bilirubin Urine: NEGATIVE
Glucose, UA: NEGATIVE mg/dL
Hgb urine dipstick: NEGATIVE
Ketones, ur: NEGATIVE mg/dL
Nitrite: NEGATIVE
Protein, ur: 30 mg/dL — AB
Specific Gravity, Urine: 1.015 (ref 1.005–1.030)
pH: 8.5 — ABNORMAL HIGH (ref 5.0–8.0)

## 2021-10-07 LAB — COMPREHENSIVE METABOLIC PANEL
ALT: 17 U/L (ref 0–44)
AST: 18 U/L (ref 15–41)
Albumin: 4.4 g/dL (ref 3.5–5.0)
Alkaline Phosphatase: 40 U/L (ref 38–126)
Anion gap: 12 (ref 5–15)
BUN: 8 mg/dL (ref 6–20)
CO2: 24 mmol/L (ref 22–32)
Calcium: 9 mg/dL (ref 8.9–10.3)
Chloride: 102 mmol/L (ref 98–111)
Creatinine, Ser: 0.81 mg/dL (ref 0.44–1.00)
GFR, Estimated: >60 mL/min (ref >60)
Glucose, Bld: 114 mg/dL — ABNORMAL HIGH (ref 70–99)
Potassium: 2.9 mmol/L — ABNORMAL LOW (ref 3.5–5.1)
Sodium: 138 mmol/L (ref 135–145)
Total Bilirubin: 0.9 mg/dL (ref 0.3–1.2)
Total Protein: 8 g/dL (ref 6.5–8.1)

## 2021-10-07 LAB — URINALYSIS, MICROSCOPIC (REFLEX)

## 2021-10-07 LAB — LIPASE, BLOOD: Lipase: 29 U/L (ref 11–51)

## 2021-10-07 LAB — MAGNESIUM: Magnesium: 1.8 mg/dL (ref 1.7–2.4)

## 2021-10-07 LAB — PREGNANCY, URINE: Preg Test, Ur: NEGATIVE

## 2021-10-07 MED ORDER — ONDANSETRON HCL 4 MG/2ML IJ SOLN
4.0000 mg | Freq: Once | INTRAMUSCULAR | Status: AC
  Administered 2021-10-07: 4 mg via INTRAVENOUS
  Filled 2021-10-07: qty 2

## 2021-10-07 MED ORDER — HYDROMORPHONE HCL 1 MG/ML IJ SOLN
1.0000 mg | Freq: Once | INTRAMUSCULAR | Status: AC
  Administered 2021-10-07: 1 mg via INTRAVENOUS
  Filled 2021-10-07: qty 1

## 2021-10-07 MED ORDER — POTASSIUM CHLORIDE 10 MEQ/100ML IV SOLN
10.0000 meq | INTRAVENOUS | Status: DC
  Administered 2021-10-07 (×2): 10 meq via INTRAVENOUS
  Filled 2021-10-07 (×2): qty 100

## 2021-10-07 MED ORDER — LACTATED RINGERS IV BOLUS
1000.0000 mL | Freq: Once | INTRAVENOUS | Status: AC
  Administered 2021-10-07: 1000 mL via INTRAVENOUS

## 2021-10-07 MED ORDER — IOHEXOL 300 MG/ML  SOLN
100.0000 mL | Freq: Once | INTRAMUSCULAR | Status: AC | PRN
  Administered 2021-10-07: 100 mL via INTRAVENOUS

## 2021-10-07 NOTE — ED Notes (Signed)
Pt given urine specimen cup and wipes and instructed on clean catch urine specimen for testing. Pt verbalizes understanding.

## 2021-10-07 NOTE — ED Triage Notes (Signed)
Pt with left abd pain for several months for few days.  Today having bloody diarrhea bright red in color and emesis.  Pt states she is not able to keep anything down today. Pain causing diaphoresis.

## 2021-10-07 NOTE — ED Provider Notes (Signed)
[facility] EMERGENCY DEPARTMENT Provider Note   CSN: [id number] Arrival date & time: 10/07/21  1925     History  Chief Complaint  Patient presents with   Abdominal Pain    Stacey Dunn is a 41 y.o. female.   Abdominal Pain Associated symptoms: diarrhea, nausea and vomiting   Associated symptoms: no chest pain, no chills, no cough, no dysuria, no fatigue, no fever, no hematuria, no shortness of breath, no sore throat, no vaginal bleeding and no vaginal discharge   Patient presents for abdominal pain, nausea, vomiting, and diarrhea.  She states that she gets the symptoms every month around the time of her menstrual period.  Currently, her menstrual period is due on the 21st.  Since last night, she has developed nausea, vomiting, and p.o. intolerance.  She has also had diarrhea which had presence of red blood in it last night and today.  She states that she typically has issues with constipation.  She reports that her symptoms are consistent with her perimenstrual symptoms that have occurred monthly over the past several months, however, the presence of blood in her stool is new.  Currently, she endorses pain in her left lower quadrant.  She denies any recent fevers or chills.      Home Medications Prior to Admission medications   Medication Sig Start Date End Date Taking? Authorizing Provider  ondansetron (ZOFRAN-ODT) 4 MG disintegrating tablet Take 1 tablet (4 mg total) by mouth every 8 (eight) hours as needed for up to 12 doses for nausea or vomiting. 10/08/21  Yes Godfrey Pick, MD  mupirocin ointment (BACTROBAN) 2 % Apply topically 2 (two) times daily. Patient not taking: Reported on 06/21/2019 05/22/19   Rod Can, CNM  norethindrone (MICRONOR) 0.35 MG tablet Take 1 tablet (0.35 mg total) by mouth daily. Patient not taking: Reported on 05/25/2019 06/04/19   Rod Can, CNM  Prenatal Vit-Fe Fumarate-FA (PRENATAL MULTIVITAMIN) TABS tablet Take 1 tablet by mouth daily.      [provider]      Allergies    Meloxicam, Morphine and related, Penicillins, Morphine, and Sulfa antibiotics    Review of Systems   Review of Systems  Constitutional:  Positive for appetite change. Negative for chills, diaphoresis, fatigue and fever.  HENT:  Negative for congestion, ear pain and sore throat.   Eyes:  Negative for pain and visual disturbance.  Respiratory:  Negative for cough and shortness of breath.   Cardiovascular:  Negative for chest pain, palpitations and leg swelling.  Gastrointestinal:  Positive for abdominal pain, blood in stool, diarrhea, nausea and vomiting. Negative for abdominal distention.  Genitourinary:  Negative for dysuria, flank pain, hematuria, pelvic pain, vaginal bleeding and vaginal discharge.  Musculoskeletal:  Negative for arthralgias, back pain, joint swelling, myalgias and neck pain.  Skin:  Negative for color change and rash.  Neurological:  Negative for dizziness, seizures, syncope, weakness, light-headedness and numbness.  Hematological:  Does not bruise/bleed easily.  Psychiatric/Behavioral:  Negative for confusion and decreased concentration.   All other systems reviewed and are negative.  Physical Exam Updated Vital Signs BP 97/63    Pulse 69    Temp 98.5 F (36.9 C)    Resp 18    Ht 5\' 4"  (1.626 m)    Wt 77.9 kg    LMP 09/10/2021    SpO2 94%    BMI 29.48 kg/m  Physical Exam Vitals and nursing note reviewed.  Constitutional:      General: She is not  in acute distress.    Appearance: She is well-developed. She is not ill-appearing, toxic-appearing or diaphoretic.  HENT:     Head: Normocephalic and atraumatic.     Mouth/Throat:     Mouth: Mucous membranes are moist.     Pharynx: Oropharynx is clear.  Eyes:     General: No scleral icterus.    Conjunctiva/sclera: Conjunctivae normal.  Cardiovascular:     Rate and Rhythm: Normal rate and regular rhythm.     Heart sounds: No murmur heard. Pulmonary:     Effort:  Pulmonary effort is normal. No respiratory distress.     Breath sounds: Normal breath sounds.  Abdominal:     Palpations: Abdomen is soft.     Tenderness: There is abdominal tenderness in the left lower quadrant. There is no guarding or rebound.     Hernia: No hernia is present.  Musculoskeletal:        General: No swelling.     Cervical back: Neck supple.  Skin:    General: Skin is warm and dry.     Capillary Refill: Capillary refill takes less than 2 seconds.  Neurological:     General: No focal deficit present.     Mental Status: She is alert and oriented to person, place, and time.     Cranial Nerves: No cranial nerve deficit.     Motor: No weakness.  Psychiatric:        Mood and Affect: Mood normal.        Behavior: Behavior normal.    ED Results / Procedures / Treatments   Labs (all labs ordered are listed, but only abnormal results are displayed) Labs Reviewed  COMPREHENSIVE METABOLIC PANEL - Abnormal; Notable for the following components:      Result Value   Potassium 2.9 (*)    Glucose, Bld 114 (*)    All other components within normal limits  CBC - Abnormal; Notable for the following components:   WBC 11.1 (*)    RBC 5.31 (*)    Hemoglobin 15.7 (*)    All other components within normal limits  URINALYSIS, ROUTINE W REFLEX MICROSCOPIC - Abnormal; Notable for the following components:   pH 8.5 (*)    Protein, ur 30 (*)    Leukocytes,Ua MODERATE (*)    All other components within normal limits  URINALYSIS, MICROSCOPIC (REFLEX) - Abnormal; Notable for the following components:   Bacteria, UA FEW (*)    All other components within normal limits  LIPASE, BLOOD  PREGNANCY, URINE  MAGNESIUM    EKG None  Radiology CT ABDOMEN PELVIS W CONTRAST  Result Date: 10/07/2021 CLINICAL DATA:  LLQ abdominal pain EXAM: CT ABDOMEN AND PELVIS WITH CONTRAST TECHNIQUE: Multidetector CT imaging of the abdomen and pelvis was performed using the standard protocol following bolus  administration of intravenous contrast. RADIATION DOSE REDUCTION: This exam was performed according to the departmental dose-optimization program which includes automated exposure control, adjustment of the mA and/or kV according to patient size and/or use of iterative reconstruction technique. CONTRAST:  100mL OMNIPAQUE IOHEXOL 300 MG/ML  SOLN COMPARISON:  None. FINDINGS: Lower chest: Acute abnormality. Hepatobiliary: No focal liver abnormality. No gallstones, gallbladder wall thickening, or pericholecystic fluid. No biliary dilatation. Pancreas: No focal lesion. Normal pancreatic contour. No surrounding inflammatory changes. No main pancreatic ductal dilatation. Spleen: Normal in size without focal abnormality. Adrenals/Urinary Tract: No adrenal nodule bilaterally. Bilateral kidneys enhance symmetrically. No hydronephrosis. No hydroureter. The urinary bladder is unremarkable. Stomach/Bowel: Stomach is within normal  limits. No evidence of bowel wall thickening or dilatation. Appendix appears normal. Vascular/Lymphatic: No abdominal aorta or iliac aneurysm. Mild atherosclerotic plaque of the aorta and its branches. No abdominal, pelvic, or inguinal lymphadenopathy. Reproductive: Corpus luteum cyst noted within the left ovary. Uterus and bilateral adnexa are unremarkable. Other: No intraperitoneal free fluid. No intraperitoneal free gas. No organized fluid collection. Musculoskeletal: No abdominal wall hernia or abnormality. No suspicious lytic or blastic osseous lesions. No acute displaced fracture. IMPRESSION: 1. No acute intra-abdominal or intrapelvic abnormality. 2.  Aortic Atherosclerosis (ICD10-I70.0). Electronically Signed   By: Iven Finn M.D.   On: 10/07/2021 22:55    Procedures Procedures    Medications Ordered in ED Medications  lactated ringers bolus 1,000 mL (0 mLs Intravenous Stopped 10/07/21 2238)  ondansetron (ZOFRAN) injection 4 mg (4 mg Intravenous Given 10/07/21 2155)  HYDROmorphone  (DILAUDID) injection 1 mg (1 mg Intravenous Given 10/07/21 2155)  iohexol (OMNIPAQUE) 300 MG/ML solution 100 mL (100 mLs Intravenous Contrast Given 10/07/21 2243)  potassium chloride (KLOR-CON) packet 40 mEq (40 mEq Oral Given 10/08/21 0100)    ED Course/ Medical Decision Making/ A&P                           Medical Decision Making Amount and/or Complexity of Data Reviewed Labs: ordered. Radiology: ordered.  Risk Prescription drug management.   This patient presents to the ED for concern of abdominal pain, vomiting, and diarrhea, this involves an extensive number of treatment options, and is a complaint that carries with it a high risk of complications and morbidity.  The differential diagnosis includes diverticulitis, enteritis, colitis, pyelonephritis, PID, IBD.   Co morbidities that complicate the patient evaluation  History of constipation, bipolar disorder, depression, endometriosis, borderline personality disorder, HTN   Additional history obtained:  Additional history obtained from patient's husband External records from outside source obtained and reviewed including EMR   Lab Tests:  I Ordered, and personally interpreted labs.  The pertinent results include: Hypokalemia, mild leukocytosis; absence of acute anemia, normal lipase, nonpregnant, no clear evidence of UTI   Imaging Studies ordered:  I ordered imaging studies including CT scan of abdomen and pelvis I independently visualized and interpreted imaging which showed no acute findings I agree with the radiologist interpretation   Cardiac Monitoring:  The patient was maintained on a cardiac monitor.  I personally viewed and interpreted the cardiac monitored which showed an underlying rhythm of: Sinus rhythm   Medicines ordered and prescription drug management:  I ordered medication including IV fluids for p.o. intolerance and Dilaudid/Zofran for symptomatic relief; potassium replacement for  hypokalemia Reevaluation of the patient after these medicines showed that the patient resolved I have reviewed the patients home medicines and have made adjustments as needed   Problem List / ED Course:  Patient is a 41 year old female who presents for abdominal pain, nausea, vomiting, p.o. intolerance, and diarrhea since last night.  Symptoms are consistent with her premenstrual symptoms over the past several months.  Since last night, however, she has had blood in her stool which is new.  On arrival, patient does appear uncomfortable.  IV fluids, Zofran, and Dilaudid were ordered.  She endorses pain in her left lower quadrant and there is associated tenderness.  Presentation is concerning for diverticulitis.  Lab work and CT scan of abdomen pelvis were ordered.  Results of lab work were reassuring.  She does have hypokalemia replacement potassium was provided in the ED.  Patient CT scan showed no acute findings.  On reassessment, patient was symptom-free.  I had an extensive conversation with her regarding the symptoms that seem to recur every month.  She has been diagnosed with endometriosis in the past and this could certainly cause similar premenstrual symptoms.  Additionally, she typically suffers from constipation.  This does put her at risk for both diverticula and hemorrhoids.  Patient's report of BRBPR is consistent with internal hemorrhoids.  Patient was counseled on benefits of stool softeners and fiber to maintain daily soft stools.  She does state that she will follow-up with her gynecologist in regards to these monthly symptoms that she has been experiencing.  Patient is tolerating p.o. intake in the ED without difficulty.  Given resolution of symptoms and reassuring work-up, I do feel the patient is appropriate for discharge home at this time.   Reevaluation:  After the interventions noted above, I reevaluated the patient and found that they have :resolved   Social Determinants of  Health:  Patient has access to outpatient medical care.   Dispostion:  After consideration of the diagnostic results and the patients response to treatment, I feel that the patent would benefit from discharge with follow-up.          Final Clinical Impression(s) / ED Diagnoses Final diagnoses:  Nausea and vomiting, unspecified vomiting type  Left lower quadrant abdominal pain  Diarrhea, unspecified type    Rx / DC Orders ED Discharge Orders          Ordered    ondansetron (ZOFRAN-ODT) 4 MG disintegrating tablet  Every 8 hours PRN        10/08/21 0021              Godfrey Pick, MD 10/09/21 1816

## 2021-10-08 ENCOUNTER — Encounter (HOSPITAL_COMMUNITY): Payer: Self-pay

## 2021-10-08 MED ORDER — POTASSIUM CHLORIDE 20 MEQ PO PACK
40.0000 meq | PACK | Freq: Once | ORAL | Status: AC
  Administered 2021-10-08: 40 meq via ORAL
  Filled 2021-10-08: qty 2

## 2021-10-08 MED ORDER — ONDANSETRON 4 MG PO TBDP: 4.0000 mg | ORAL_TABLET | Freq: Three times a day (TID) | ORAL | 0 refills | Status: AC | PRN

## 2021-10-08 NOTE — Discharge Instructions (Signed)
Follow-up with your primary care doctor and your gynecology doctor for your ongoing symptoms.  Return to the ED for any worsening.

## 2022-05-04 ENCOUNTER — Emergency Department: Payer: Medicaid Other

## 2022-05-04 ENCOUNTER — Emergency Department
Admission: EM | Admit: 2022-05-04 | Discharge: 2022-05-04 | Disposition: A | Discharge status: Home / Self Care | Payer: Medicaid Other | Attending: Emergency Medicine | Admitting: Emergency Medicine

## 2022-05-04 ENCOUNTER — Other Ambulatory Visit: Payer: Self-pay

## 2022-05-04 DIAGNOSIS — Y92410 Unspecified street and highway as the place of occurrence of the external cause: Secondary | ICD-10-CM | POA: Insufficient documentation

## 2022-05-04 DIAGNOSIS — J449 Chronic obstructive pulmonary disease, unspecified: Secondary | ICD-10-CM | POA: Insufficient documentation

## 2022-05-04 DIAGNOSIS — I10 Essential (primary) hypertension: Secondary | ICD-10-CM | POA: Insufficient documentation

## 2022-05-04 DIAGNOSIS — S93401A Sprain of unspecified ligament of right ankle, initial encounter: Secondary | ICD-10-CM | POA: Diagnosis not present

## 2022-05-04 DIAGNOSIS — S99911A Unspecified injury of right ankle, initial encounter: Secondary | ICD-10-CM | POA: Diagnosis present

## 2022-05-04 DIAGNOSIS — S63502A Unspecified sprain of left wrist, initial encounter: Secondary | ICD-10-CM | POA: Diagnosis not present

## 2022-05-04 HISTORY — DX: Personal history of other infectious and parasitic diseases: Z86.19

## 2022-05-04 MED ORDER — METHOCARBAMOL 500 MG PO TABS: 500.0000 mg | ORAL_TABLET | Freq: Three times a day (TID) | ORAL | 0 refills | Status: DC | PRN

## 2022-05-04 MED ORDER — TRAMADOL HCL 50 MG PO TABS: 50.0000 mg | ORAL_TABLET | Freq: Four times a day (QID) | ORAL | 0 refills | Status: DC | PRN

## 2022-05-04 MED ORDER — OXYCODONE HCL 5 MG PO TABS
5.0000 mg | ORAL_TABLET | Freq: Once | ORAL | Status: AC
  Administered 2022-05-04: 5 mg via ORAL
  Filled 2022-05-04: qty 1
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
## 2022-05-04 NOTE — Discharge Instructions (Addendum)
Be aware that the methocarbamol and the tramadol may make you drowsy or dizzy. Do not take them at the same time.  Return to the ER for any additional concerns if unable to schedule an appointment with primary care.
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
## 2022-05-04 NOTE — ED Triage Notes (Signed)
See First RN note.  Pt c/o R ankle and L hand pain.  Pain score 10/10.  Pt c/o limited ROM to L hand and R ankle.  Swelling and deformity noted to ankle.

## 2022-05-06 DIAGNOSIS — S9001XA Contusion of right ankle, initial encounter: Secondary | ICD-10-CM | POA: Insufficient documentation

## 2022-05-06 DIAGNOSIS — S63502A Unspecified sprain of left wrist, initial encounter: Secondary | ICD-10-CM

## 2022-05-06 DIAGNOSIS — S93401A Sprain of unspecified ligament of right ankle, initial encounter: Secondary | ICD-10-CM

## 2022-05-06 HISTORY — DX: Sprain of unspecified ligament of right ankle, initial encounter: S93.401A

## 2022-05-06 HISTORY — DX: Contusion of right ankle, initial encounter: S90.01XA

## 2022-05-06 HISTORY — DX: Unspecified sprain of left wrist, initial encounter: S63.502A

## 2022-05-15 ENCOUNTER — Ambulatory Visit
Admission: RE | Admit: 2022-05-15 | Discharge: 2022-05-15 | Disposition: A | Discharge status: Home / Self Care | Payer: Medicaid Other | Source: Ambulatory Visit | Attending: Family Medicine | Admitting: Family Medicine

## 2022-05-15 ENCOUNTER — Other Ambulatory Visit: Payer: Self-pay | Admitting: Family Medicine

## 2022-05-15 ENCOUNTER — Ambulatory Visit
Admission: RE | Admit: 2022-05-15 | Discharge: 2022-05-15 | Disposition: A | Discharge status: Home / Self Care | Payer: Medicaid Other | Attending: Family Medicine | Admitting: Family Medicine

## 2022-05-15 DIAGNOSIS — M25532 Pain in left wrist: Secondary | ICD-10-CM

## 2022-05-19 ENCOUNTER — Other Ambulatory Visit: Payer: Self-pay | Admitting: Family Medicine

## 2022-05-19 ENCOUNTER — Other Ambulatory Visit (HOSPITAL_COMMUNITY): Payer: Self-pay | Admitting: Family Medicine

## 2022-05-19 DIAGNOSIS — M25532 Pain in left wrist: Secondary | ICD-10-CM

## 2022-09-03 ENCOUNTER — Emergency Department
Admission: EM | Admit: 2022-09-03 | Discharge: 2022-09-03 | Disposition: A | Discharge status: Home / Self Care | Payer: Medicaid Other | Attending: Student in an Organized Health Care Education/Training Program | Admitting: Student in an Organized Health Care Education/Training Program

## 2022-09-03 ENCOUNTER — Other Ambulatory Visit: Payer: Self-pay

## 2022-09-03 ENCOUNTER — Emergency Department: Payer: Medicaid Other

## 2022-09-03 DIAGNOSIS — Z3A01 Less than 8 weeks gestation of pregnancy: Secondary | ICD-10-CM | POA: Insufficient documentation

## 2022-09-03 DIAGNOSIS — O209 Hemorrhage in early pregnancy, unspecified: Secondary | ICD-10-CM | POA: Diagnosis present

## 2022-09-03 DIAGNOSIS — O200 Threatened abortion: Secondary | ICD-10-CM | POA: Insufficient documentation

## 2022-09-03 LAB — CBC WITH DIFFERENTIAL/PLATELET
Abs Immature Granulocytes: 0.04 10*3/uL (ref 0.00–0.07)
Basophils Absolute: 0.1 10*3/uL (ref 0.0–0.1)
Basophils Relative: 1 %
Eosinophils Absolute: 0.1 10*3/uL (ref 0.0–0.5)
Eosinophils Relative: 1 %
HCT: 43.9 % (ref 36.0–46.0)
Hemoglobin: 14.8 g/dL (ref 12.0–15.0)
Immature Granulocytes: 0 %
Lymphocytes Relative: 25 %
Lymphs Abs: 2.8 10*3/uL (ref 0.7–4.0)
MCH: 28.5 pg (ref 26.0–34.0)
MCHC: 33.7 g/dL (ref 30.0–36.0)
MCV: 84.6 fL (ref 80.0–100.0)
Monocytes Absolute: 0.6 10*3/uL (ref 0.1–1.0)
Monocytes Relative: 5 %
Neutro Abs: 7.4 10*3/uL (ref 1.7–7.7)
Neutrophils Relative %: 68 %
Platelets: 279 10*3/uL (ref 150–400)
RBC: 5.19 MIL/uL — ABNORMAL HIGH (ref 3.87–5.11)
RDW: 13.2 % (ref 11.5–15.5)
WBC: 10.9 10*3/uL — ABNORMAL HIGH (ref 4.0–10.5)
nRBC: 0 % (ref 0.0–0.2)

## 2022-09-03 LAB — BASIC METABOLIC PANEL
Anion gap: 3 — ABNORMAL LOW (ref 5–15)
BUN: 10 mg/dL (ref 6–20)
CO2: 25 mmol/L (ref 22–32)
Calcium: 8.5 mg/dL — ABNORMAL LOW (ref 8.9–10.3)
Chloride: 111 mmol/L (ref 98–111)
Creatinine, Ser: 0.74 mg/dL (ref 0.44–1.00)
GFR, Estimated: >60 mL/min (ref >60)
Glucose, Bld: 109 mg/dL — ABNORMAL HIGH (ref 70–99)
Potassium: 3.2 mmol/L — ABNORMAL LOW (ref 3.5–5.1)
Sodium: 139 mmol/L (ref 135–145)

## 2022-09-03 LAB — URINALYSIS, ROUTINE W REFLEX MICROSCOPIC
Bacteria, UA: NONE SEEN
Bilirubin Urine: NEGATIVE
Glucose, UA: NEGATIVE mg/dL
Ketones, ur: NEGATIVE mg/dL
Leukocytes,Ua: NEGATIVE
Nitrite: NEGATIVE
Protein, ur: NEGATIVE mg/dL
Specific Gravity, Urine: 1.002 — ABNORMAL LOW (ref 1.005–1.030)
Squamous Epithelial / HPF: NONE SEEN (ref 0–5)
WBC, UA: NONE SEEN WBC/hpf (ref 0–5)
pH: 6 (ref 5.0–8.0)

## 2022-09-03 LAB — POC URINE PREG, ED
Preg Test, Ur: NEGATIVE
Preg Test, Ur: NEGATIVE
Preg Test, Ur: NEGATIVE
Preg Test, Ur: NEGATIVE

## 2022-09-03 LAB — ANTIBODY SCREEN: Antibody Screen: POSITIVE

## 2022-09-03 LAB — ABO/RH: ABO/RH(D): O NEG

## 2022-09-03 LAB — HCG, QUANTITATIVE, PREGNANCY: hCG, Beta Chain, Quant, S: 8 m[IU]/mL — ABNORMAL HIGH (ref <5)

## 2022-09-03 MED ORDER — RHO D IMMUNE GLOBULIN 1500 UNIT/2ML IJ SOSY
300.0000 ug | PREFILLED_SYRINGE | Freq: Once | INTRAMUSCULAR | Status: AC
  Administered 2022-09-03: 300 ug via INTRAMUSCULAR
  Filled 2022-09-03: qty 2

## 2022-09-03 NOTE — ED Triage Notes (Signed)
Pt states she is 5 weeks 2 days pregnant, pt started vaginal bleeding this am, noting blood when she wipes. Pt with hx of miscarriage in 2021. Pt with c/o nausea with pregnancy.

## 2022-09-03 NOTE — Discharge Instructions (Signed)
Follow-up in OB clinic or the ED in 48 hours for repeat hCG.

## 2022-09-03 NOTE — ED Provider Notes (Signed)
[facility] Provider Note    Event Date/Time   First MD Initiated Contact with Patient 09/03/22 1219     (approximate)   History   Vaginal Bleeding   HPI  Stacey Dunn is a 41 y.o. female  [phone] for evaluation of vaginal bleeding and cramping started last night.  States she is roughly [redacted] weeks pregnant.  Has had previous miscarriage.  Not currently have any bleeding or pain at this time.  Called OB clinic was told to come to the ER for RhoGAM as she is Rh- as well as ultrasound.  She has not been seen regarding this pregnancy yet.        Physical Exam   Triage Vital Signs: ED Triage Vitals  Enc Vitals Group     BP 09/03/22 1125 (!) 129/91     Pulse Rate 09/03/22 1125 97     Resp 09/03/22 1125 20     Temp 09/03/22 1125 98.4 F (36.9 C)     Temp Source 09/03/22 1125 Oral     SpO2 09/03/22 1125 99 %     Weight 09/03/22 1126 149 lb (67.6 kg)     Height 09/03/22 1126 5\' 4"  (1.626 m)     Head Circumference --      Peak Flow --      Pain Score 09/03/22 1126 0     Pain Loc --      Pain Edu? --      Excl. in GC? --     Most recent vital signs: Vitals:   09/03/22 1125  BP: (!) 129/91  Pulse: 97  Resp: 20  Temp: 98.4 F (36.9 C)  SpO2: 99%     Constitutional: Alert  Eyes: Conjunctivae are normal.  Head: Atraumatic. Nose: No congestion/rhinnorhea. Mouth/Throat: Mucous membranes are moist.   Neck: Painless ROM.  Cardiovascular:   Good peripheral circulation. Respiratory: Normal respiratory effort.  No retractions.  Gastrointestinal: Soft and nontender.  Musculoskeletal:  no deformity Neurologic:  MAE spontaneously. No gross focal neurologic deficits are appreciated.  Skin:  Skin is warm, dry and intact. No rash noted. Psychiatric: Mood and affect are normal. Speech and behavior are normal.    ED Results / Procedures / Treatments   Labs (all labs ordered are listed, but only abnormal results are displayed) Labs Reviewed   BASIC METABOLIC PANEL - Abnormal; Notable for the following components:      Result Value   Potassium 3.2 (*)    Glucose, Bld 109 (*)    Calcium 8.5 (*)    Anion gap 3 (*)    All other components within normal limits  CBC WITH DIFFERENTIAL/PLATELET - Abnormal; Notable for the following components:   WBC 10.9 (*)    RBC 5.19 (*)    All other components within normal limits  URINALYSIS, ROUTINE W REFLEX MICROSCOPIC - Abnormal; Notable for the following components:   Color, Urine STRAW (*)    APPearance CLEAR (*)    Specific Gravity, Urine 1.002 (*)    Hgb urine dipstick MODERATE (*)    All other components within normal limits  HCG, QUANTITATIVE, PREGNANCY - Abnormal; Notable for the following components:   hCG, Beta Chain, Quant, S 8 (*)    All other components within normal limits  POC URINE PREG, ED  POC URINE PREG, ED  POC URINE PREG, ED  POC URINE PREG, ED  ABO/RH  ANTIBODY SCREEN  RHOGAM INJECTION     EKG  RADIOLOGY Please see ED Course for my review and interpretation.  I personally reviewed all radiographic images ordered to evaluate for the above acute complaints and reviewed radiology reports and findings.  These findings were personally discussed with the patient.  Please see medical record for radiology report.    PROCEDURES:  Critical Care performed:   Procedures   MEDICATIONS ORDERED IN ED: Medications  rho (d) immune globulin (RHIG/RHOPHYLAC) injection 300 mcg (300 mcg Intramuscular Given 09/03/22 1410)     IMPRESSION / MDM / ASSESSMENT AND PLAN / ED COURSE  I reviewed the triage vital signs and the nursing notes.                              Differential diagnosis includes, but is not limited to, AUB, DU B, threatened miscarriage, miscarriage, ectopic  Patient presenting to the ER for evaluation of symptoms as described above.  Based on symptoms, risk factors and considered above differential, this presenting complaint could reflect a  potentially life-threatening illness therefore the patient will be placed on continuous pulse oximetry and telemetry for monitoring.  Laboratory evaluation will be sent to evaluate for the above complaints.  Patient is Rh- will order RhoGAM if she is having bleeding in early pregnancy.   Clinical Course as of 09/03/22 1451  Thu Sep 03, 2022  1450 Ultrasound on my review and interpretation does not show evidence evidence of IUP.  Differential at this point includes pregnancy of unknown certain location versus miscarriage.  This is not consistent with ruptured ectopic.  Her quant is very low and urine pregnancy negative.  We discussed results of her ultrasound and my concern that this is likely miscarriage but that dates and quant too early to be certain and will need follow-up in 48 hours for repeat quant.  Discussed conservative management and strict return precautions.  Patient agreeable to plan. [PR]    Clinical Course User Index [PR] Willy Eddy, MD      FINAL CLINICAL IMPRESSION(S) / ED DIAGNOSES   Final diagnoses:  Threatened miscarriage     Rx / DC Orders   ED Discharge Orders     None        Note:  This document was prepared using Dragon voice recognition software and may include unintentional dictation errors.    Willy Eddy, MD 09/03/22 1451

## 2022-09-04 LAB — RHOGAM INJECTION: Unit division: 0

## 2023-04-01 ENCOUNTER — Other Ambulatory Visit: Payer: Self-pay | Admitting: Family Medicine

## 2023-04-01 DIAGNOSIS — R222 Localized swelling, mass and lump, trunk: Secondary | ICD-10-CM

## 2023-04-08 ENCOUNTER — Other Ambulatory Visit: Payer: Self-pay | Admitting: Family Medicine

## 2023-04-08 ENCOUNTER — Ambulatory Visit
Admission: RE | Admit: 2023-04-08 | Discharge: 2023-04-08 | Disposition: A | Discharge status: Home / Self Care | Payer: Medicaid Other | Source: Ambulatory Visit | Attending: Family Medicine | Admitting: Family Medicine

## 2023-04-08 DIAGNOSIS — R222 Localized swelling, mass and lump, trunk: Secondary | ICD-10-CM | POA: Diagnosis present

## 2023-05-11 ENCOUNTER — Other Ambulatory Visit (HOSPITAL_COMMUNITY)
Admission: RE | Admit: 2023-05-11 | Discharge: 2023-05-11 | Disposition: A | Discharge status: Home / Self Care | Payer: Medicaid Other | Source: Ambulatory Visit | Attending: Obstetrics & Gynecology | Admitting: Obstetrics & Gynecology

## 2023-05-11 ENCOUNTER — Ambulatory Visit (INDEPENDENT_AMBULATORY_CARE_PROVIDER_SITE_OTHER): Payer: Medicaid Other | Admitting: Obstetrics & Gynecology

## 2023-05-11 ENCOUNTER — Encounter: Payer: Self-pay | Admitting: Obstetrics & Gynecology

## 2023-05-11 VITALS — BP 148/72 | HR 103 | Ht 64.0 in | Wt 170.0 lb

## 2023-05-11 DIAGNOSIS — Z124 Encounter for screening for malignant neoplasm of cervix: Secondary | ICD-10-CM

## 2023-05-11 DIAGNOSIS — N946 Dysmenorrhea, unspecified: Secondary | ICD-10-CM

## 2023-05-11 MED ORDER — AMITRIPTYLINE HCL 25 MG PO TABS: 25.0000 mg | ORAL_TABLET | Freq: Every day | ORAL | 4 refills | Status: DC

## 2023-05-11 NOTE — Progress Notes (Signed)
    GYNECOLOGY PROGRESS NOTE  Subjective:    Patient ID: Margaret Pyle, female    DOB: 1981-08-21, 42 y.o.   MRN: [id number]  HPI  Patient is a 42 y.o. married [id number] (4 and 30 yo kids) here as a new patient with a long h/o left flank pain. The pain started about 17 years ago but has worsened in the last 4 years. She had a CT at [facility] 02/2023 that showed normal gyn organs. She had renal ultrasound there that showed grade 1 hydronephrosis.  She is actively trying to conceive. She had tried POPs in the past but stopped them in order to conceive.   The following portions of the patient's history were reviewed and updated as appropriate: allergies, current medications, past family history, past medical history, past social history, past surgical history, and problem list.  Review of Systems Pertinent items are noted in HPI.  She has difficulty sleeping, takes 150mg  of dephenhydramine at bedtime to sleep.  Objective:   Blood pressure (!) 148/72, pulse (!) 103, height 5\' 4"  (1.626 m), weight 170 lb (77.1 kg), unknown if currently breastfeeding. Body mass index is 29.18 kg/m. Well nourished, well hydrated White female, no apparent distress She is ambulating and conversing normally. Multiple tattoos Abd- benign EG- normal Speculum exam- I used the [employer] and she needed lots of coaching in order to relax enough to allow placement of the spec. I obtained a pap smear. Bimanual exam - NSS, no masses, no point tenderness   Assessment:   1. Screening for cervical cancer      Plan:   1. Screening for cervical cancer  - Cytology - PAP with cultures  2. Desire for pregnancy- rec that she continue PNVs daily, rec stop smoking  3. Dysmenorrhea- she doesn't want to use POPS, wants a baby and says that they didn't help. I will give her a trial of elavil 25 mg at bedtime. She will only take 50 mg of benadryl at most.  4. Left flank pain- uncertain etiology  Come back in 4 weeks

## 2023-05-17 ENCOUNTER — Encounter: Payer: Self-pay | Admitting: Obstetrics & Gynecology

## 2023-05-17 LAB — CYTOLOGY - PAP
Chlamydia: NEGATIVE
Comment: NEGATIVE
Comment: NEGATIVE
Comment: NORMAL
Diagnosis: UNDETERMINED — AB
High risk HPV: NEGATIVE
Neisseria Gonorrhea: NEGATIVE

## 2023-06-08 ENCOUNTER — Encounter: Payer: Self-pay | Admitting: Obstetrics & Gynecology

## 2023-06-08 ENCOUNTER — Ambulatory Visit (INDEPENDENT_AMBULATORY_CARE_PROVIDER_SITE_OTHER): Payer: Medicaid Other | Admitting: Obstetrics & Gynecology

## 2023-06-08 VITALS — BP 126/89 | HR 105 | Ht 64.0 in | Wt 178.0 lb

## 2023-06-08 DIAGNOSIS — R109 Unspecified abdominal pain: Secondary | ICD-10-CM | POA: Diagnosis not present

## 2023-06-08 DIAGNOSIS — G4700 Insomnia, unspecified: Secondary | ICD-10-CM | POA: Diagnosis not present

## 2023-06-08 DIAGNOSIS — G8929 Other chronic pain: Secondary | ICD-10-CM | POA: Diagnosis not present

## 2023-06-08 MED ORDER — AMITRIPTYLINE HCL 25 MG PO TABS: ORAL_TABLET | ORAL | 4 refills | Status: DC

## 2023-06-08 NOTE — Progress Notes (Addendum)
GYNECOLOGY PROGRESS NOTE  Subjective:    Patient ID: Margaret Pyle, female    DOB: [birth year], 42 y.o.   MRN: [id number]  HPI  Patient is a 42 y.o. [id number] here today for follow up after starting elavil 25 mg at bedtime to treat her 17 year history of left flank pain. She is still having trouble sleeping. She started taking 1 pill in the morning as well and feels like her mood is definitely improving. She says that the pain is a tiny bit better, but she feels that some of her pain is related to her anxiety/moods. She denies any side effects from the med except for some vivid dreams.  The following portions of the patient's history were reviewed and updated as appropriate: allergies, current medications, past family history, past medical history, past social history, past surgical history, and problem list.  Review of Systems Pertinent items are noted in HPI.   Objective:   unknown if currently breastfeeding. There is no height or weight on file to calculate BMI. Well nourished, well hydrated White female, no apparent distress She is ambulating and conversing normally. FH- 36 FHR- 132 Vertex lie   Assessment:  Chronic pelvic/left flank pain insomnia  Plan:  She is having improvement in her mood/pain/life with elavil but is still having insomnia. She will continue 25 mg q am and increase to 50 mg at bedtime. I will see her in about 2 months. It is fine to do this via video or phone visit.

## 2023-08-09 ENCOUNTER — Ambulatory Visit (INDEPENDENT_AMBULATORY_CARE_PROVIDER_SITE_OTHER): Payer: Medicaid Other | Admitting: Obstetrics & Gynecology

## 2023-08-09 VITALS — BP 123/77 | HR 109 | Wt 188.5 lb

## 2023-08-09 DIAGNOSIS — R1032 Left lower quadrant pain: Secondary | ICD-10-CM | POA: Diagnosis not present

## 2023-08-09 DIAGNOSIS — G8929 Other chronic pain: Secondary | ICD-10-CM | POA: Insufficient documentation

## 2023-08-09 NOTE — Progress Notes (Signed)
    GYNECOLOGY PROGRESS NOTE  Subjective:    Patient ID: Stacey Dunn, female    DOB: 1981/06/30, 42 y.o.   MRN: [id number]  HPI  Patient is a 42 y.o. P2 here for follow up of her left lower quadrant/ left flank pain. This started about 17 years ago but has gotten much worse in the last 4 years (since the birth of her daughter). The pain is not every day and can actually go away for several weeks but it always comes back. It is very sharp, associated with vomitting and feeling the need to defecate. She generally has a BM about every other day but sometimes has diarrhea. She actually drank a bottle of milk of magnesium recently because she was hoping that having a cleanse might help her pain. It has not been helpful. She reports lots of bloating and weight gain.  She had a CT not long ago that showed normal pelvic organs and Grade 1 left hydronephrosis.  She has been trying elavil BID. Initially this seemed to help a little, but is no longer helpful. She has increased her dose but still no help.  She declines contraception, would like to concieve.  The following portions of the patient's history were reviewed and updated as appropriate: allergies, current medications, past family history, past medical history, past social history, past surgical history, and problem list.  Review of Systems Pertinent items are noted in HPI.   Objective:   Blood pressure 123/77, pulse (!) 109, weight 188 lb 8 oz (85.5 kg), last menstrual period 07/25/2023, unknown if currently breastfeeding. Body mass index is 32.36 kg/m. Well nourished, well hydrated White female, no apparent distress She is ambulating  normally. She is sitting crooked in the chair, favoring her left side, appears to be in some discomfort, somewhat tearful. Pelvic exam deferred    Assessment:  LLQ pain- uncertain etiology  Plan:   I think that she would benefit from an evaluation by a GI, possibly has IBS.

## 2023-10-18 ENCOUNTER — Other Ambulatory Visit: Payer: Self-pay

## 2023-10-19 ENCOUNTER — Encounter: Payer: Self-pay | Admitting: Physician Assistant

## 2023-10-19 ENCOUNTER — Ambulatory Visit (INDEPENDENT_AMBULATORY_CARE_PROVIDER_SITE_OTHER): Payer: Medicaid Other | Admitting: Physician Assistant

## 2023-10-19 VITALS — BP 109/66 | HR 74 | Temp 97.7°F | Ht 64.0 in | Wt 189.8 lb

## 2023-10-19 DIAGNOSIS — K5909 Other constipation: Secondary | ICD-10-CM | POA: Diagnosis not present

## 2023-10-19 DIAGNOSIS — K219 Gastro-esophageal reflux disease without esophagitis: Secondary | ICD-10-CM

## 2023-10-19 DIAGNOSIS — K5904 Chronic idiopathic constipation: Secondary | ICD-10-CM

## 2023-10-19 DIAGNOSIS — R112 Nausea with vomiting, unspecified: Secondary | ICD-10-CM

## 2023-10-19 DIAGNOSIS — E041 Nontoxic single thyroid nodule: Secondary | ICD-10-CM

## 2023-10-19 DIAGNOSIS — R1032 Left lower quadrant pain: Secondary | ICD-10-CM | POA: Diagnosis not present

## 2023-10-19 MED ORDER — OMEPRAZOLE 20 MG PO CPDR: 20.0000 mg | DELAYED_RELEASE_CAPSULE | Freq: Every day | ORAL | 5 refills | Status: DC

## 2023-10-19 MED ORDER — LACTULOSE 20 GM/30ML PO SOLN
30.0000 mL | Freq: Every day | ORAL | 5 refills | Status: AC
Start: 2023-10-19 — End: 2024-04-16

## 2023-10-19 NOTE — Progress Notes (Signed)
Celso Amy, PA-C [address]  [address]  [place], [place] [address]  Main: [phone]  Fax: [phone]   Gastroenterology Consultation  Referring Provider:     Martie Round, NP Primary Care Physician:  Martie Round, NP Primary Gastroenterologist:  Celso Amy, PA-C  Reason for Consultation:     Abdominal Pain, N/V, Constipation        HPI:   Stacey Dunn is a 43 y.o. y/o female referred for consultation & management  by Martie Round, NP.    Patient has chronic intermittent LLQ pain, chronic constipation, and episodes of nausea and vomiting.  She has had intermittent GI symptoms for 4 or 5 years since the birth of her last daughter.  She tried MiraLAX for constipation with no benefit.  Currently takes milk of magnesia, probiotic, and Gas-X as needed.  She admits to moderate acid reflux and heartburn.  Denies dysphagia or weight loss.  Has intermittent episodes of nausea and vomiting 1 or 2 times per month.  Admits to daily marijuana use.  Occasional ibuprofen.  She has had 3 miscarriages.  Is 2 days late on her menstrual cycle.  She has had C-section.  No other abdominal or pelvic surgeries.  No previous GI evaluation, EGD, or colonoscopy.  She had OB/GYN evaluation for chronic left flank pain.  GYN record indicates she has had chronic left flank pain for 17 years.  03/2023 Limited ultrasound to evaluate left lower flank was normal.  09/2021 abdominal pelvic CT with contrast: To evaluate LLQ pain: Showed no acute abnormality.  No explanation for LLQ pain.  Last labs 02/2023: Elevated WBC 13, elevated hemoglobin 16.2.  Negative pregnancy test.  Normal CMP.  Of note, she has noticed an enlarged lump on her right thyroid for the past 3 months.  She has not had this evaluated.  She sees [facility] for primary care.  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 "Nodules right lung."  No follow-up by client.   History of MRSA infection    on face   History of staph infection    Hypertension    History "PIH"   Migraine    History   Nausea & vomiting    associated with pregnancy   Sprain of left wrist 05/06/2022   Sprain of right ankle 05/06/2022   Varicosities of leg    Wears partial dentures    Upper    Past Surgical History:  Procedure Laterality Date   ANKLE SURGERY Right    For Staph infection   CESAREAN SECTION     CESAREAN SECTION N/A 05/19/2019   Procedure: CESAREAN SECTION- Repeat SEX: FEMALE TOB 2303 weight 9lbs 0oz apgars: 7min:9, 19min:9;  Surgeon: Natale Milch, MD;  Location: [facility] ORS;  Service: Obstetrics;  Laterality: N/A;   history of C-section x2      Prior to Admission medications   Medication Sig Start Date  End Date Taking? Authorizing Provider  ondansetron (ZOFRAN-ODT) 4 MG disintegrating tablet Take 1 tablet (4 mg total) by mouth every 8 (eight) hours as needed for up to 12 doses for nausea or vomiting. 10/08/21  Yes Gloris Manchester, MD    Family History  Problem Relation Age of Onset   Kidney disease Mother    Renal Disease Mother    Hypertension Mother    Alcohol abuse Father    Hypertension Sister    Cancer Sister    Heart Problems Sister    Cancer Maternal Uncle    Cancer Maternal Grandfather    Cancer Paternal Grandfather    Diabetes Paternal Grandfather    Epilepsy Sister    Migraines Sister      Social History   Tobacco Use   Smoking status: Every Day    Current packs/day: 1.00    Average packs/day: 1 pack/day for 21.0 years (21.0 ttl pk-yrs)     Types: Cigarettes   Smokeless tobacco: Never  Vaping Use   Vaping status: Former   Substances: Nicotine  Substance Use Topics   Alcohol use: Yes    Comment: occ   Drug use: Yes    Types: Marijuana    Comment: Last marijuana use one first week of 05/2020    Allergies as of 10/19/2023 - Review Complete 10/19/2023  Allergen Reaction Noted   Meloxicam Swelling 03/17/2016   Morphine and codeine Hives and Other (See Comments) 10/15/2013   Penicillins Hives 10/15/2013   Morphine Hives, Other (See Comments), and Rash 01/09/2013   Sulfa antibiotics Hives and Rash 01/09/2013    Review of Systems:    All systems reviewed and negative except where noted in HPI.   Physical Exam:  BP 109/66   Pulse 74   Temp 97.7 F (36.5 C)   Ht 5\' 4"  (1.626 m)   Wt 189 lb 12.8 oz (86.1 kg)   LMP 08/31/2023   BMI 32.58 kg/m  Patient's last menstrual period was 08/31/2023.  General:   Alert,  Well-developed, well-nourished, pleasant and cooperative in NAD Lungs:  Respirations even and unlabored.  Clear throughout to auscultation.   No wheezes, crackles, or rhonchi. No acute distress. Heart:  Regular rate and rhythm; no murmurs, clicks, rubs, or gallops. Abdomen:  Normal bowel sounds.  No bruits.  Soft, and non-distended without masses, hepatosplenomegaly or hernias noted.  Mild LLQ Tenderness; Rest of abdomen is not tender.  No guarding or rebound tenderness.    Neurologic:  Alert and oriented x3;  grossly normal neurologically. Psych:  Alert and cooperative. Normal mood and affect.  Imaging Studies: No results found.  Assessment and Plan:   Stacey Dunn is a 43 y.o. y/o female has been referred for:    LLQ Pain - Chronic x many years (17 years?) Chronic Constipation Episodic nausea and vomiting GERD Right Thyroid Nodule  Plan: -Labs: CBC, CMP, Lipase, HCG, TSH, Free T4 -H. Pylori Breat Test -Rx Lactulose 30 mL daily. -Rx Omeprazole 20mg  daily. -F/U with PCP for Thyroid  nodule. -Stop Marijuana Use; Avoid NSAIDS -Pending above test results and response to treatment, Abdominal ultrasound, EGD, and colonoscopy would be the next steps.  Follow up 4 weeks with TG  Celso Amy, PA-C

## 2023-10-20 ENCOUNTER — Other Ambulatory Visit: Payer: Self-pay | Admitting: Physician Assistant

## 2023-10-21 ENCOUNTER — Encounter: Payer: Self-pay | Admitting: Physician Assistant

## 2023-10-21 LAB — CBC WITH DIFF/PLATELET
Basophils Absolute: 0 10*3/uL (ref 0.0–0.2)
Basos: 1 %
EOS (ABSOLUTE): 0.2 10*3/uL (ref 0.0–0.4)
Eos: 4 %
Hematocrit: 46.8 % — ABNORMAL HIGH (ref 34.0–46.6)
Hemoglobin: 15.5 g/dL (ref 11.1–15.9)
Immature Grans (Abs): 0 10*3/uL (ref 0.0–0.1)
Immature Granulocytes: 0 %
Lymphocytes Absolute: 2.5 10*3/uL (ref 0.7–3.1)
Lymphs: 43 %
MCH: 28.9 pg (ref 26.6–33.0)
MCHC: 33.1 g/dL (ref 31.5–35.7)
MCV: 87 fL (ref 79–97)
Monocytes Absolute: 0.4 10*3/uL (ref 0.1–0.9)
Monocytes: 6 %
Neutrophils Absolute: 2.6 10*3/uL (ref 1.4–7.0)
Neutrophils: 46 %
Platelets: 270 10*3/uL (ref 150–450)
RBC: 5.36 x10E6/uL — ABNORMAL HIGH (ref 3.77–5.28)
RDW: 13.2 % (ref 11.7–15.4)
WBC: 5.7 10*3/uL (ref 3.4–10.8)

## 2023-10-22 LAB — COMPREHENSIVE METABOLIC PANEL
ALT: 18 [IU]/L (ref 0–32)
AST: 17 [IU]/L (ref 0–40)
Albumin: 4.4 g/dL (ref 3.9–4.9)
Alkaline Phosphatase: 53 [IU]/L (ref 44–121)
BUN/Creatinine Ratio: 9 (ref 9–23)
BUN: 8 mg/dL (ref 6–24)
Bilirubin Total: 0.4 mg/dL (ref 0.0–1.2)
CO2: 20 mmol/L (ref 20–29)
Calcium: 9.3 mg/dL (ref 8.7–10.2)
Chloride: 104 mmol/L (ref 96–106)
Creatinine, Ser: 0.92 mg/dL (ref 0.57–1.00)
Globulin, Total: 2.4 g/dL (ref 1.5–4.5)
Glucose: 107 mg/dL — ABNORMAL HIGH (ref 70–99)
Potassium: 3.8 mmol/L (ref 3.5–5.2)
Sodium: 142 mmol/L (ref 134–144)
Total Protein: 6.8 g/dL (ref 6.0–8.5)
eGFR: 81 mL/min/{1.73_m2} (ref >59)

## 2023-10-22 LAB — HCG, SERUM, QUALITATIVE: hCG,Beta Subunit,Qual,Serum: NEGATIVE m[IU]/mL (ref <6)

## 2023-10-22 LAB — H. PYLORI BREATH TEST: H pylori Breath Test: NEGATIVE

## 2023-10-22 LAB — LIPASE: Lipase: 24 U/L (ref 14–72)

## 2023-10-22 LAB — TSH+FREE T4
Free T4: 1.01 ng/dL (ref 0.82–1.77)
TSH: 2.47 u[IU]/mL (ref 0.450–4.500)

## 2023-11-23 ENCOUNTER — Ambulatory Visit: Payer: Medicaid Other | Admitting: Physician Assistant

## 2023-11-23 NOTE — Progress Notes (Deleted)
 Celso Amy, PA-C [address]  [address]  [place], [place] [address]  Main: [phone]  Fax: [phone]   Primary Care Physician: Martie Round, NP  Primary Gastroenterologist:  ***  CC: Follow-up  HPI: Stacey Dunn is a 43 y.o. female returns for 5-week follow-up of chronic intermittent LLQ pain, chronic constipation, episodic nausea and vomiting, and GERD.  Has had GI symptoms for 4 or 5 years.  Tried MiraLAX in the past with no benefit.  10/20/2023 labs: CBC, CMP, lipase, TSH, free T4 all normal.  hCG negative.  H. pylori breath test negative.  She was started on lactulose 30 mL daily, and omeprazole 20 Mg daily  She was advised to stop marijuana use and avoid NSAIDs.  Also advised to follow-up with PCP for thyroid nodule.   03/2023 Limited ultrasound to evaluate left lower flank was normal.   09/2021 abdominal pelvic CT with contrast: To evaluate LLQ pain: Showed no acute abnormality.  No explanation for LLQ pain.   Current Outpatient Medications  Medication Sig Dispense Refill   Lactulose 20 GM/30ML SOLN Take 30 mLs (20 g total) by mouth daily. 900 mL 5   omeprazole (PRILOSEC) 20 MG capsule Take 1 capsule (20 mg total) by mouth daily. 30 capsule 5   ondansetron (ZOFRAN-ODT) 4 MG disintegrating tablet Take 1 tablet (4 mg total) by mouth every 8 (eight) hours as needed for up to 12 doses for nausea or vomiting. 12 tablet 0   No current facility-administered medications for this visit.    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 "Nodules right lung."  No follow-up by client.   History of MRSA infection    on face   History of staph infection    Hypertension    History "PIH"   Migraine    History   Nausea & vomiting    associated with pregnancy   Sprain of left wrist 05/06/2022   Sprain of right ankle 05/06/2022   Varicosities of leg    Wears partial dentures    Upper    Past Surgical History:  Procedure Laterality Date   ANKLE SURGERY Right    For Staph infection   CESAREAN SECTION     CESAREAN SECTION N/A 05/19/2019   Procedure: CESAREAN SECTION- Repeat SEX: FEMALE TOB 2303 weight 9lbs 0oz apgars: 56min:9, 41min:9;  Surgeon: Natale Milch, MD;  Location: [facility] ORS;  Service: Obstetrics;  Laterality: N/A;   history of C-section x2      Review of Systems:    All systems reviewed and negative except where noted in HPI.  Physical Examination:   There were no vitals taken for this visit.  General: Well-nourished, well-developed in no acute distress.  Lungs: Clear to auscultation bilaterally. Non-labored. Heart: Regular rate and rhythm, no murmurs rubs or gallops.  Abdomen: Bowel sounds are normal; Abdomen is Soft; No hepatosplenomegaly, masses or hernias;  No Abdominal Tenderness; No guarding or rebound tenderness. Neuro: Alert and oriented x 3.  Grossly intact.  Psych: Alert and cooperative, normal mood and affect.   Imaging Studies: No results found.  Assessment and Plan:   Stacey Dunn is a 43 y.o. y/o female returns for follow-up of   LLQ Pain - Chronic x many years (17 years?) Chronic Constipation Episodic nausea and vomiting GERD Right Thyroid  Nodule  Celso Amy, PA-C  Follow up ***  BP check ***

## 2024-03-20 ENCOUNTER — Emergency Department

## 2024-03-20 ENCOUNTER — Emergency Department
Admission: EM | Admit: 2024-03-20 | Discharge: 2024-03-20 | Disposition: A | Discharge status: Home / Self Care | Attending: Emergency Medicine | Admitting: Emergency Medicine

## 2024-03-20 ENCOUNTER — Other Ambulatory Visit: Payer: Self-pay

## 2024-03-20 DIAGNOSIS — R1032 Left lower quadrant pain: Secondary | ICD-10-CM | POA: Insufficient documentation

## 2024-03-20 DIAGNOSIS — R102 Pelvic and perineal pain: Secondary | ICD-10-CM | POA: Insufficient documentation

## 2024-03-20 LAB — COMPREHENSIVE METABOLIC PANEL WITH GFR
ALT: 32 U/L (ref 0–44)
AST: 26 U/L (ref 15–41)
Albumin: 4.2 g/dL (ref 3.5–5.0)
Alkaline Phosphatase: 43 U/L (ref 38–126)
Anion gap: 9 (ref 5–15)
BUN: 11 mg/dL (ref 6–20)
CO2: 24 mmol/L (ref 22–32)
Calcium: 9 mg/dL (ref 8.9–10.3)
Chloride: 106 mmol/L (ref 98–111)
Creatinine, Ser: 0.8 mg/dL (ref 0.44–1.00)
GFR, Estimated: >60 mL/min (ref >60)
Glucose, Bld: 115 mg/dL — ABNORMAL HIGH (ref 70–99)
Potassium: 3.8 mmol/L (ref 3.5–5.1)
Sodium: 139 mmol/L (ref 135–145)
Total Bilirubin: 0.6 mg/dL (ref 0.0–1.2)
Total Protein: 7.2 g/dL (ref 6.5–8.1)

## 2024-03-20 LAB — CBC
HCT: 44.9 % (ref 36.0–46.0)
Hemoglobin: 15 g/dL (ref 12.0–15.0)
MCH: 28.1 pg (ref 26.0–34.0)
MCHC: 33.4 g/dL (ref 30.0–36.0)
MCV: 84.2 fL (ref 80.0–100.0)
Platelets: 276 10*3/uL (ref 150–400)
RBC: 5.33 MIL/uL — ABNORMAL HIGH (ref 3.87–5.11)
RDW: 12.8 % (ref 11.5–15.5)
WBC: 6.8 10*3/uL (ref 4.0–10.5)
nRBC: 0 % (ref 0.0–0.2)

## 2024-03-20 LAB — LIPASE, BLOOD: Lipase: 29 U/L (ref 11–51)

## 2024-03-20 LAB — URINALYSIS, ROUTINE W REFLEX MICROSCOPIC
Bilirubin Urine: NEGATIVE
Glucose, UA: NEGATIVE mg/dL
Hgb urine dipstick: NEGATIVE
Ketones, ur: NEGATIVE mg/dL
Leukocytes,Ua: NEGATIVE
Nitrite: NEGATIVE
Protein, ur: NEGATIVE mg/dL
Specific Gravity, Urine: 1.012 (ref 1.005–1.030)
pH: 5 (ref 5.0–8.0)

## 2024-03-20 LAB — POC URINE PREG, ED: Preg Test, Ur: NEGATIVE

## 2024-03-20 MED ORDER — ONDANSETRON 4 MG PO TBDP
4.0000 mg | ORAL_TABLET | Freq: Once | ORAL | Status: AC
  Administered 2024-03-20: 4 mg via ORAL
  Filled 2024-03-20: qty 1

## 2024-03-20 MED ORDER — METOCLOPRAMIDE HCL 5 MG PO TABS: 5.0000 mg | ORAL_TABLET | Freq: Three times a day (TID) | ORAL | 0 refills | Status: DC | PRN

## 2024-03-20 MED ORDER — HYDROCODONE-ACETAMINOPHEN 5-325 MG PO TABS: 1.0000 | ORAL_TABLET | Freq: Three times a day (TID) | ORAL | 0 refills | Status: AC | PRN

## 2024-03-20 MED ORDER — HYDROCODONE-ACETAMINOPHEN 5-325 MG PO TABS
1.0000 | ORAL_TABLET | Freq: Once | ORAL | Status: AC
  Administered 2024-03-20: 1 via ORAL
  Filled 2024-03-20: qty 1

## 2024-03-20 NOTE — ED Provider Notes (Signed)
 [facility] Emergency Department Provider Note     Event Date/Time   First MD Initiated Contact with Patient 03/20/24 1044     (approximate)   History   Abdominal Pain   HPI  Stacey Dunn is a 43 y.o. female with a history of obesity, history of bipolar disorder, chronic cannabis use disorder, chronic LLQ pain abdominal pain, presents to the ED endorsing left lower quadrant abdominal pain patient was onset of symptoms this morning.  She denies any bowel changes.  No nausea, vomiting, or diarrhea.  Physical Exam   Triage Vital Signs: ED Triage Vitals  Encounter Vitals Group     BP 03/20/24 1015 126/72     Girls Systolic BP Percentile --      Girls Diastolic BP Percentile --      Boys Systolic BP Percentile --      Boys Diastolic BP Percentile --      Pulse Rate 03/20/24 1015 97     Resp 03/20/24 1015 20     Temp 03/20/24 1015 98.5 F (36.9 C)     Temp Source 03/20/24 1015 Oral     SpO2 03/20/24 1015 99 %     Weight 03/20/24 1017 200 lb (90.7 kg)     Height 03/20/24 1017 5' 4 (1.626 m)     Head Circumference --      Peak Flow --      Pain Score 03/20/24 1013 8     Pain Loc --      Pain Education --      Exclude from Growth Chart --     Most recent vital signs: Vitals:   03/20/24 1015 03/20/24 1405  BP: 126/72 120/70  Pulse: 97 88  Resp: 20 18  Temp: 98.5 F (36.9 C) 98 F (36.7 C)  SpO2: 99% 99%    General Awake, no distress. NAD. tearful HEENT NCAT. PERRL. EOMI. No rhinorrhea. Mucous membranes are moist.  CV:  Good peripheral perfusion.  RESP:  Normal effort.  ABD:  No distention. Mildly tender to palp over the LLQ. No rebound, guarding, or rigidity. GYN:  deferred   ED Results / Procedures / Treatments   Labs (all labs ordered are listed, but only abnormal results are displayed) Labs Reviewed  COMPREHENSIVE METABOLIC PANEL WITH GFR - Abnormal; Notable for the following components:      Result Value   Glucose, Bld  115 (*)    All other components within normal limits  CBC - Abnormal; Notable for the following components:   RBC 5.33 (*)    All other components within normal limits  URINALYSIS, ROUTINE W REFLEX MICROSCOPIC - Abnormal; Notable for the following components:   Color, Urine YELLOW (*)    APPearance CLEAR (*)    All other components within normal limits  LIPASE, BLOOD  POC URINE PREG, ED    EKG   RADIOLOGY  I personally viewed and evaluated these images as part of my medical decision making, as well as reviewing the written report by the radiologist.  ED Provider Interpretation: No acute findings to explain patient's symptoms  US  PELVIC COMPLETE WITH TRANSVAGINAL Result Date: 03/20/2024 CLINICAL DATA:  Pelvic pain for 1 day. No previous relevant surgery. LMP 03/06/2024 EXAM: TRANSABDOMINAL AND TRANSVAGINAL ULTRASOUND OF PELVIS TECHNIQUE: Both transabdominal and transvaginal ultrasound examinations of the pelvis were performed. Transabdominal technique was performed for global imaging of the pelvis including uterus, ovaries, adnexal regions, and pelvic cul-de-sac. It was necessary  to proceed with endovaginal exam following the transabdominal exam to visualize the endometrium and ovaries to better advantage. COMPARISON:  Pelvic CT 10/07/2021.  Obstetric ultrasound 09/03/2022. FINDINGS: Uterus Measurements: 8.2 x 4.4 x 5.5 cm = volume: 104.9 mL. No fibroids or other mass visualized. Endometrium Thickness: 8 mm. Trace fluid in the endometrial canal. No focal endometrial abnormality identified. Right ovary Not visualized Left ovary Measurements: 2.7 x 1.7 x 2.4 cm = volume: 6.0 mL. Normal appearance/no adnexal mass. Normal blood flow with color Doppler. Other findings No abnormal free fluid. IMPRESSION: 1. No acute findings or explanation for the patient's symptoms. 2. Nonvisualization of the right ovary. Electronically Signed   By: Elsie Perone M.D.   On: 03/20/2024 14:36     PROCEDURES:  Critical Care performed: No  Procedures   MEDICATIONS ORDERED IN ED: Medications  ondansetron  (ZOFRAN -ODT) disintegrating tablet 4 mg (4 mg Oral Given 03/20/24 1239)  HYDROcodone -acetaminophen  (NORCO/VICODIN) 5-325 MG per tablet 1 tablet (1 tablet Oral Given 03/20/24 1238)     IMPRESSION / MDM / ASSESSMENT AND PLAN / ED COURSE  I reviewed the triage vital signs and the nursing notes.                              Differential diagnosis includes, but is not limited to, ovarian cyst, ovarian torsion, acute appendicitis, diverticulitis, urinary tract infection/pyelonephritis, bowel obstruction, colitis, renal colic, gastroenteritis, hernia, fibroids, endometriosis, pregnancy related pain including ectopic pregnancy, etc.  Patient's presentation is most consistent with acute presentation with potential threat to life or bodily function.  Patient's diagnosis is consistent with acute on chronic left lower quadrant abdominal pain with unclear etiology with overall reassuring exam and workup at this time.  No acute lab allergies noted.  No evidence on ultrasound, interpreted by me, to explain the patient's chronic symptoms.  Patient will be discharged home with prescriptions for hydrocodone  (#9). Patient is to follow up with GI medicine, GYN, and her PCP as discussed in detail, as needed or otherwise directed. Patient is given ED precautions to return to the ED for any worsening or new symptoms.   FINAL CLINICAL IMPRESSION(S) / ED DIAGNOSES   Final diagnoses:  Left lower quadrant abdominal pain  Pelvic pain     Rx / DC Orders   ED Discharge Orders          Ordered    metoCLOPramide  (REGLAN ) 5 MG tablet  Every 8 hours PRN,   Status:  Discontinued        03/20/24 1459    HYDROcodone -acetaminophen  (NORCO/VICODIN) 5-325 MG tablet  3 times daily PRN        03/20/24 1459    metoCLOPramide  (REGLAN ) 5 MG tablet  Every 8 hours PRN        03/20/24 1509              Note:  This document was prepared using Dragon voice recognition software and may include unintentional dictation errors.    Loyd Candida LULLA Aldona, PA-C 03/20/24 1947    Suzanne Kirsch, MD 03/24/24 1627

## 2024-03-20 NOTE — Discharge Instructions (Addendum)
 Your exam, labs, and ultrasound are normal and reassuring. There are no findings to explain your symptoms. Follow-up with your GI specialist, GYN specialist, and your primary care provider for further evaluation of your symptoms.

## 2024-03-20 NOTE — ED Triage Notes (Signed)
 Pt to ED for LLQ abdominal pain since this morning, hx of same and has been happening 3-4 times/month recently. Normal BM's, last today. Usually happens with ovulation or around period. Stats also has full body rash to legs and arms, has been treated for ringworm, has seen PCP several times for same. Has also been vomiting. No dysuria.

## 2024-03-20 NOTE — ED Notes (Signed)
 See triage note  Presents with some abd pain  States pain started this am  The pain is mainly on LLQ  and has happened in the past

## 2024-04-03 ENCOUNTER — Ambulatory Visit (INDEPENDENT_AMBULATORY_CARE_PROVIDER_SITE_OTHER): Payer: Medicaid Other | Admitting: Dermatology

## 2024-04-03 DIAGNOSIS — L920 Granuloma annulare: Secondary | ICD-10-CM

## 2024-04-03 DIAGNOSIS — R21 Rash and other nonspecific skin eruption: Secondary | ICD-10-CM | POA: Diagnosis not present

## 2024-04-03 NOTE — Progress Notes (Signed)
   Follow Up Visit   Subjective  Stacey Dunn is a 43 y.o. female who presents for the following: Rash on the medial thighs and arms, started 10 years ago on the medial knees, current rashes present for around 6 years, not itchy. Rash didn't improve while she was on oral Lamisil for toenail fungus. Topical antifungal creams didn't help either. Mother with history of Lupus.    The following portions of the chart were reviewed this encounter and updated as appropriate: medications, allergies, medical history  Review of Systems:  No other skin or systemic complaints except as noted in HPI or Assessment and Plan.  Objective  Well appearing patient in no apparent distress; mood and affect are within normal limits.  A focused examination was performed of the following areas: Arms, legs  Relevant exam findings are noted in the Assessment and Plan.  Left Medial Thigh, thighs, right upper arm Violaceous pink annular smooth patch left > right upper medial thigh, scattered light pink firm papules right upper arm, thighs.   Assessment & Plan   RASH Left Medial Thigh, thighs, right upper arm Punch biopsy to left medial thigh today.  Skin / nail biopsy - Left Medial Thigh Type of biopsy: punch   Informed consent: discussed and consent obtained   Patient was prepped and draped in usual sterile fashion: Area prepped with alcohol. Anesthesia: the lesion was anesthetized in a standard fashion   Anesthetic:  1% lidocaine w/ epinephrine 1-100,000 buffered w/ 8.4% NaHCO3 Punch size:  4 mm Suture size:  4-0 Suture type: nylon   Suture removal (days):  7 Hemostasis achieved with: suture and pressure   Outcome: patient tolerated procedure well   Post-procedure details: wound care instructions given   Post-procedure details comment:  Ointment and pressure dressing applied  Specimen 1 - Surgical pathology Differential Diagnosis: Granuloma Annulare vs other Check Margins: No  4 mm punch  biopsy    Return in about 1 week (around 04/10/2024) for biopsy follow-up.  IAndrea Kerns, CMA, am acting as scribe for Rexene Rattler, MD .   Documentation: I have reviewed the above documentation for accuracy and completeness, and I agree with the above.  Rexene Rattler, MD

## 2024-04-03 NOTE — Patient Instructions (Addendum)
 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 Its cause is not known, and most episodes of granuloma annulare clear up after a few years, with or without treatment.   Wound Care Instructions  Cleanse wound gently with soap and water once a day then pat dry with clean gauze. Apply a thin coat of Petrolatum (petroleum jelly, Vaseline) over the wound (unless you have an allergy to this). We recommend that you use a new, sterile tube of Vaseline. Do not pick or remove scabs. Do not remove the yellow or white healing tissue from the base of the wound.  Cover the wound with fresh, clean, nonstick gauze and secure with paper tape. You may use Band-Aids in place of gauze and tape if the wound is small enough, but would recommend trimming much of the tape off as there is often too much. Sometimes Band-Aids can irritate the skin.  You should call the office for your biopsy report after 1 week if you have not already been contacted.  If you experience any problems, such as abnormal amounts of bleeding, swelling, significant bruising, significant pain, or evidence of infection, please call the office immediately.  FOR ADULT SURGERY PATIENTS: If you need something for pain relief you may take 1 extra strength Tylenol  (acetaminophen ) AND 2 Ibuprofen  (200mg  each) together every 4 hours as needed for pain. (do not take these if you are allergic to them or if you have a reason you should not take them.) Typically, you may only need pain medication for 1 to 3 days.      Due to recent changes in healthcare laws, you may see results of your pathology and/or laboratory studies on MyChart before the doctors have had a chance to review them. We understand that in some cases there may be results that are confusing or concerning to you. Please understand that not all results are  received at the same time and often the doctors may need to interpret multiple results in order to provide you with the best plan of care or course of treatment. Therefore, we ask that you please give us  2 business days to thoroughly review all your results before contacting the office for clarification. Should we see a critical lab result, you will be contacted sooner.   If You Need Anything After Your Visit  If you have any questions or concerns for your doctor, please call our main line at [phone] and press option 4 to reach your doctor's medical assistant. If no one answers, please leave a voicemail as directed and we will return your call as soon as possible. Messages left after 4 pm will be answered the following business day.   You may also send us  a message via MyChart. We typically respond to MyChart messages within 1-2 business days.  For prescription refills, please ask your pharmacy to contact our office. Our fax number is [phone].  If you have an urgent issue when the clinic is closed that cannot wait until the next business day, you can page your doctor at the number below.    Please note that while we do our best to be available for urgent issues outside of office hours, we are not available 24/7.   If you have an urgent issue and are unable to reach us , you may choose to seek medical care at your doctor's office, retail clinic, urgent care  center, or emergency room.  If you have a medical emergency, please immediately call 911 or go to the emergency department.  Pager Numbers  - Dr. Hester: [phone]  - Dr. Jackquline: [phone]  - Dr. Claudene: [phone]   In the event of inclement weather, please call our main line at [phone] for an update on the status of any delays or closures.  Dermatology Medication Tips: Please keep the boxes that topical medications come in in order to help keep track of the instructions about where and how to use these.  Pharmacies typically print the medication instructions only on the boxes and not directly on the medication tubes.   If your medication is too expensive, please contact our office at [phone] option 4 or send us  a message through MyChart.   We are unable to tell what your co-pay for medications will be in advance as this is different depending on your insurance coverage. However, we may be able to find a substitute medication at lower cost or fill out paperwork to get insurance to cover a needed medication.   If a prior authorization is required to get your medication covered by your insurance company, please allow us  1-2 business days to complete this process.  Drug prices often vary depending on where the prescription is filled and some pharmacies may offer cheaper prices.  The website www.goodrx.com contains coupons for medications through different pharmacies. The prices here do not account for what the cost may be with help from insurance (it may be cheaper with your insurance), but the website can give you the price if you did not use any insurance.  - You can print the associated coupon and take it with your prescription to the pharmacy.  - You may also stop by our office during regular business hours and pick up a GoodRx coupon card.  - If you need your prescription sent electronically to a different pharmacy, notify our office through [facility] or by phone at [phone] option 4.     Si Usted Necesita Algo Despus de Su Visita  Tambin puede enviarnos un mensaje a travs de Clinical cytogeneticist. Por lo general respondemos a los mensajes de MyChart en el transcurso de 1 a 2 das hbiles.  Para renovar recetas, por favor pida a su farmacia que se ponga en contacto con nuestra oficina. Randi lakes de fax es [place] [phone].  Si tiene un asunto urgente cuando la clnica est cerrada y que no puede esperar hasta el siguiente da hbil, puede llamar/localizar a su doctor(a) al nmero  que aparece a continuacin.   Por favor, tenga en cuenta que aunque hacemos todo lo posible para estar disponibles para asuntos urgentes fuera del horario de [place], no estamos disponibles las 24 horas del da, los 7 [address]  [address] de la [place].   Si tiene un problema urgente y no puede comunicarse con nosotros, puede optar por buscar atencin mdica  en el consultorio de su doctor(a), en una clnica privada, en un centro de atencin urgente o en una sala de emergencias.  Si tiene Engineer, drilling, por favor llame inmediatamente al 911 o vaya a la sala de emergencias.  Nmeros de bper  - Dr. Hester: [phone]  - Dra. Jackquline: [phone]  - Dr. Claudene: [phone]   En caso de inclemencias del tiempo, por favor llame a landry capes principal al [phone] para una actualizacin sobre el [place] de cualquier retraso o cierre.  Consejos para la medicacin en dermatologa: Por favor, guarde las cajas  en las que vienen los medicamentos de uso tpico para ayudarle a seguir las instrucciones sobre dnde y cmo usarlos. Las farmacias generalmente imprimen las instrucciones del medicamento slo en las cajas y no directamente en los tubos del [place].   Si su medicamento es muy caro, por favor, pngase en contacto con landry rieger llamando al [phone] y presione la opcin 4 o envenos un mensaje a travs de Clinical cytogeneticist.   No podemos decirle cul ser su copago por los medicamentos por adelantado ya que esto es diferente dependiendo de la cobertura de su seguro. Sin embargo, es posible que podamos encontrar un medicamento sustituto a Audiological scientist un formulario para que el seguro cubra el medicamento que se considera necesario.   Si se requiere una autorizacin previa para que su compaa de seguros malta su medicamento, por favor permtanos de 1 a 2 das hbiles para completar este proceso.  Los precios de los medicamentos varan con frecuencia dependiendo del Environmental consultant de dnde se  surte la receta y alguna farmacias pueden ofrecer precios ms baratos.  El sitio web www.goodrx.com tiene cupones para medicamentos de Health and safety inspector. Los precios aqu no tienen en cuenta lo que podra costar con la ayuda del seguro (puede ser ms barato con su seguro), pero el sitio web puede darle el precio si no utiliz Tourist information centre manager.  - Puede imprimir el cupn correspondiente y llevarlo con su receta a la farmacia.  - Tambin puede pasar por nuestra oficina durante el horario de atencin regular y Education officer, museum una tarjeta de cupones de GoodRx.  - Si necesita que su receta se enve electrnicamente a una farmacia diferente, informe a nuestra oficina a travs de MyChart de [place] o por telfono llamando al [phone] y presione la opcin 4.

## 2024-04-05 LAB — SURGICAL PATHOLOGY

## 2024-04-10 ENCOUNTER — Ambulatory Visit: Payer: Self-pay | Admitting: Dermatology

## 2024-04-10 ENCOUNTER — Ambulatory Visit (INDEPENDENT_AMBULATORY_CARE_PROVIDER_SITE_OTHER): Admitting: Dermatology

## 2024-04-10 DIAGNOSIS — L920 Granuloma annulare: Secondary | ICD-10-CM

## 2024-04-10 MED ORDER — CLOBETASOL PROPIONATE 0.05 % EX CREA: 1.0000 | TOPICAL_CREAM | CUTANEOUS | 0 refills | Status: DC

## 2024-04-10 MED ORDER — TACROLIMUS 0.1 % EX OINT: TOPICAL_OINTMENT | CUTANEOUS | 2 refills | Status: DC

## 2024-04-10 NOTE — Patient Instructions (Addendum)
 Start Clobetasol  cream twice a day for 2 weeks then in 2 weeks decrease to once a day for 2 weeks then stop  In 2 weeks start Tacrolimus  ointment once daily to areas  Granuloma annulare is a chronic benign skin condition characterized by pink smooth bumps or ring-like plaques most commonly appearing over the joints and the backs of the hands/feet. Its cause is not known, and most episodes of granuloma annulare clear up after a few years, with or without treatment.    Due to recent changes in healthcare laws, you may see results of your pathology and/or laboratory studies on MyChart before the doctors have had a chance to review them. We understand that in some cases there may be results that are confusing or concerning to you. Please understand that not all results are received at the same time and often the doctors may need to interpret multiple results in order to provide you with the best plan of care or course of treatment. Therefore, we ask that you please give us  2 business days to thoroughly review all your results before contacting the office for clarification. Should we see a critical lab result, you will be contacted sooner.   If You Need Anything After Your Visit  If you have any questions or concerns for your doctor, please call our main line at [phone] and press option 4 to reach your doctor's medical assistant. If no one answers, please leave a voicemail as directed and we will return your call as soon as possible. Messages left after 4 pm will be answered the following business day.   You may also send us  a message via MyChart. We typically respond to MyChart messages within 1-2 business days.  For prescription refills, please ask your pharmacy to contact our office. Our fax number is [phone].  If you have an urgent issue when the clinic is closed that cannot wait until the next business day, you can page your doctor at the number below.    Please note that while we do  our best to be available for urgent issues outside of office hours, we are not available 24/7.   If you have an urgent issue and are unable to reach us , you may choose to seek medical care at your doctor's office, retail clinic, urgent care center, or emergency room.  If you have a medical emergency, please immediately call 911 or go to the emergency department.  Pager Numbers  - Dr. Hester: [phone]  - Dr. Jackquline: [phone]  - Dr. Claudene: [phone]   In the event of inclement weather, please call our main line at [phone] for an update on the status of any delays or closures.  Dermatology Medication Tips: Please keep the boxes that topical medications come in in order to help keep track of the instructions about where and how to use these. Pharmacies typically print the medication instructions only on the boxes and not directly on the medication tubes.   If your medication is too expensive, please contact our office at [phone] option 4 or send us  a message through MyChart.   We are unable to tell what your co-pay for medications will be in advance as this is different depending on your insurance coverage. However, we may be able to find a substitute medication at lower cost or fill out paperwork to get insurance to cover a needed medication.   If a prior authorization is required to get your medication covered by your insurance company, please allow  us  1-2 business days to complete this process.  Drug prices often vary depending on where the prescription is filled and some pharmacies may offer cheaper prices.  The website www.goodrx.com contains coupons for medications through different pharmacies. The prices here do not account for what the cost may be with help from insurance (it may be cheaper with your insurance), but the website can give you the price if you did not use any insurance.  - You can print the associated coupon and take it with your prescription to  the pharmacy.  - You may also stop by our office during regular business hours and pick up a GoodRx coupon card.  - If you need your prescription sent electronically to a different pharmacy, notify our office through [facility] or by phone at [phone] option 4.     Si Usted Necesita Algo Despus de Su Visita  Tambin puede enviarnos un mensaje a travs de Clinical cytogeneticist. Por lo general respondemos a los mensajes de MyChart en el transcurso de 1 a 2 das hbiles.  Para renovar recetas, por favor pida a su farmacia que se ponga en contacto con nuestra oficina. Randi lakes de fax es [place] [phone].  Si tiene un asunto urgente cuando la clnica est cerrada y que no puede esperar hasta el siguiente da hbil, puede llamar/localizar a su doctor(a) al nmero que aparece a continuacin.   Por favor, tenga en cuenta que aunque hacemos todo lo posible para estar disponibles para asuntos urgentes fuera del horario de [place], no estamos disponibles las 24 horas del da, los 7 [address]  [address] de la [place].   Si tiene un problema urgente y no puede comunicarse con nosotros, puede optar por buscar atencin mdica  en el consultorio de su doctor(a), en una clnica privada, en un centro de atencin urgente o en una sala de emergencias.  Si tiene Engineer, drilling, por favor llame inmediatamente al 911 o vaya a la sala de emergencias.  Nmeros de bper  - Dr. Hester: [phone]  - Dra. Jackquline: [phone]  - Dr. Claudene: [phone]   En caso de inclemencias del tiempo, por favor llame a landry capes principal al [phone] para una actualizacin sobre el [place] de cualquier retraso o cierre.  Consejos para la medicacin en dermatologa: Por favor, guarde las cajas en las que vienen los medicamentos de uso tpico para ayudarle a seguir las instrucciones sobre dnde y cmo usarlos. Las farmacias generalmente imprimen las instrucciones del medicamento slo en las cajas y no directamente en  los tubos del [place].   Si su medicamento es muy caro, por favor, pngase en contacto con landry rieger llamando al [phone] y presione la opcin 4 o envenos un mensaje a travs de Clinical cytogeneticist.   No podemos decirle cul ser su copago por los medicamentos por adelantado ya que esto es diferente dependiendo de la cobertura de su seguro. Sin embargo, es posible que podamos encontrar un medicamento sustituto a Audiological scientist un formulario para que el seguro cubra el medicamento que se considera necesario.   Si se requiere una autorizacin previa para que su compaa de seguros malta su medicamento, por favor permtanos de 1 a 2 das hbiles para completar este proceso.  Los precios de los medicamentos varan con frecuencia dependiendo del Environmental consultant de dnde se surte la receta y alguna farmacias pueden ofrecer precios ms baratos.  El sitio web www.goodrx.com tiene cupones para medicamentos de Health and safety inspector. Los precios aqu no tienen en cuenta lo que  podra costar con la ayuda del seguro (puede ser ms barato con su seguro), pero el sitio web puede darle el precio si no Visual merchandiser.  - Puede imprimir el cupn correspondiente y llevarlo con su receta a la farmacia.  - Tambin puede pasar por nuestra oficina durante el horario de atencin regular y Education officer, museum una tarjeta de cupones de GoodRx.  - Si necesita que su receta se enve electrnicamente a una farmacia diferente, informe a nuestra oficina a travs de MyChart de  o por telfono llamando al [phone] y presione la opcin 4.

## 2024-04-10 NOTE — Progress Notes (Signed)
   Follow-Up Visit   Subjective  Stacey Dunn is a 43 y.o. female who presents for the following: Bx proven Granuloma Annulare, pt presents for suture removal and discuss bx results, pt said the suture came out yesterday   The following portions of the chart were reviewed this encounter and updated as appropriate: medications, allergies, medical history  Review of Systems:  No other skin or systemic complaints except as noted in HPI or Assessment and Plan.  Objective  Well appearing patient in no apparent distress; mood and affect are within normal limits.   A focused examination was performed of the following areas: L med thigh  Relevant exam findings are noted in the Assessment and Plan.    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 Condition is symptomatic/ bothersome to patient. Not currently at goal.   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 Its cause is not known, and most episodes of granuloma annulare clear up after a few years, with or without treatment.   Treatment Plan:  Suture came out yesterday Discussed results as above Start  Clobetasol  cr bid x 2 weeks, then decrease to qd x 2 weeks then d/c, Caution skin atrophy with long-term use. Avoid f/g/a In 2 weeks Start Tacrolimus  0.1% ointment qd  Topical steroids (such as triamcinolone , fluocinolone, fluocinonide, mometasone, clobetasol , halobetasol, betamethasone, hydrocortisone) can cause thinning and lightening of the skin if they are used for too long in the same area. Your physician has selected the right strength medicine for your problem and area affected on the body. Please use your medication only as  directed by your physician to prevent side effects.      Return if symptoms worsen or fail to improve.  I, Grayce Saunas, RMA, am acting as scribe for Rexene Rattler, MD .   Documentation: I have reviewed the above documentation for accuracy and completeness, and I agree with the above.  Rexene Rattler, MD

## 2024-04-11 ENCOUNTER — Encounter: Payer: Self-pay | Admitting: Dermatology

## 2024-04-12 ENCOUNTER — Telehealth: Payer: Self-pay

## 2024-04-12 NOTE — Telephone Encounter (Signed)
 Patient's insurance is denying prescription for tacrolimus  0.1% ointment. GoodRx cheapest cash prices are for $56.78 at any [facility], $71.58 at [facility] or would you like to send alternative rx for patient?  Please advise.

## 2024-04-17 MED ORDER — PIMECROLIMUS 1 % EX CREA: TOPICAL_CREAM | Freq: Two times a day (BID) | CUTANEOUS | 0 refills | Status: AC

## 2024-04-17 NOTE — Telephone Encounter (Signed)
 Sent in rx for pimecrolimus .

## 2024-04-19 MED ORDER — TACROLIMUS 0.1 % EX OINT: TOPICAL_OINTMENT | CUTANEOUS | 2 refills | Status: AC

## 2024-04-19 NOTE — Addendum Note (Signed)
 Addended by: WILFRED FREDIE GAILS on: 04/19/2024 11:41 AM   Modules accepted: Orders

## 2024-04-19 NOTE — Telephone Encounter (Signed)
 Patient's insurance is denying coverage for Elidel . Spoke with patient and advised her of denial. Advised patient about using GoodRx coupon for tacrolimus  [facility] if [facility] for patient and patient okay with that. Resent rx for tacrolimus  to [facility] in [place].   Patient also stated since using clobetasol  cream has noticed places more inflamed, sore, and acne-like bumps. Please advise.

## 2024-05-01 NOTE — Telephone Encounter (Signed)
 Left voicemail for patient to return call.

## 2024-08-09 ENCOUNTER — Other Ambulatory Visit: Payer: Self-pay | Admitting: Dermatology
